# Patient Record
Sex: Female | Born: 1970 | Race: Black or African American | Hispanic: No | Marital: Single | Smoking: Never smoker
Health system: Southern US, Community
[De-identification: ages and names within clinical notes are randomized; demographics above are authoritative.]

## PROBLEM LIST (undated history)

## (undated) DIAGNOSIS — Z9289 Personal history of other medical treatment: Secondary | ICD-10-CM

## (undated) DIAGNOSIS — D649 Anemia, unspecified: Secondary | ICD-10-CM

## (undated) DIAGNOSIS — N926 Irregular menstruation, unspecified: Secondary | ICD-10-CM

## (undated) DIAGNOSIS — R011 Cardiac murmur, unspecified: Secondary | ICD-10-CM

## (undated) DIAGNOSIS — I10 Essential (primary) hypertension: Secondary | ICD-10-CM

## (undated) DIAGNOSIS — R51 Headache: Secondary | ICD-10-CM

## (undated) DIAGNOSIS — N840 Polyp of corpus uteri: Secondary | ICD-10-CM

---

## 1992-04-06 HISTORY — PX: TUBAL LIGATION: SHX77

## 2002-04-06 DIAGNOSIS — N840 Polyp of corpus uteri: Secondary | ICD-10-CM

## 2002-04-06 HISTORY — DX: Polyp of corpus uteri: N84.0

## 2002-04-06 HISTORY — PX: DILATION AND CURETTAGE OF UTERUS: SHX78

## 2003-01-02 ENCOUNTER — Inpatient Hospital Stay (HOSPITAL_COMMUNITY): Admission: AD | Admit: 2003-01-02 | Discharge: 2003-01-03 | Payer: Self-pay | Admitting: *Deleted

## 2003-01-03 ENCOUNTER — Inpatient Hospital Stay (HOSPITAL_COMMUNITY): Admission: RE | Admit: 2003-01-03 | Discharge: 2003-01-03 | Payer: Self-pay | Admitting: *Deleted

## 2003-01-03 ENCOUNTER — Encounter: Payer: Self-pay | Admitting: *Deleted

## 2003-01-25 ENCOUNTER — Encounter (INDEPENDENT_AMBULATORY_CARE_PROVIDER_SITE_OTHER): Payer: Self-pay

## 2003-01-25 ENCOUNTER — Other Ambulatory Visit: Admission: RE | Admit: 2003-01-25 | Discharge: 2003-01-25 | Payer: Self-pay | Admitting: Family Medicine

## 2003-01-25 ENCOUNTER — Encounter: Admission: RE | Admit: 2003-01-25 | Discharge: 2003-01-25 | Payer: Self-pay | Admitting: Obstetrics and Gynecology

## 2003-02-08 ENCOUNTER — Encounter: Admission: RE | Admit: 2003-02-08 | Discharge: 2003-02-08 | Payer: Self-pay | Admitting: Obstetrics and Gynecology

## 2003-04-05 ENCOUNTER — Encounter: Admission: RE | Admit: 2003-04-05 | Discharge: 2003-04-05 | Payer: Self-pay | Admitting: Family Medicine

## 2003-04-09 ENCOUNTER — Ambulatory Visit (HOSPITAL_COMMUNITY): Admission: RE | Admit: 2003-04-09 | Discharge: 2003-04-09 | Payer: Self-pay | Admitting: Family Medicine

## 2006-01-18 ENCOUNTER — Emergency Department (HOSPITAL_COMMUNITY): Admission: EM | Admit: 2006-01-18 | Discharge: 2006-01-18 | Payer: Self-pay | Admitting: Emergency Medicine

## 2006-10-27 ENCOUNTER — Inpatient Hospital Stay (HOSPITAL_COMMUNITY): Admission: AD | Admit: 2006-10-27 | Discharge: 2006-10-27 | Payer: Self-pay | Admitting: Gynecology

## 2008-07-09 ENCOUNTER — Emergency Department (HOSPITAL_COMMUNITY): Admission: EM | Admit: 2008-07-09 | Discharge: 2008-07-09 | Payer: Self-pay | Admitting: Emergency Medicine

## 2010-07-16 LAB — POCT I-STAT, CHEM 8
BUN: 10 mg/dL (ref 6–23)
Calcium, Ion: 1.16 mmol/L (ref 1.12–1.32)
Chloride: 105 mEq/L (ref 96–112)
HCT: 34 % — ABNORMAL LOW (ref 36.0–46.0)
Potassium: 3.7 mEq/L (ref 3.5–5.1)

## 2010-08-22 NOTE — Group Therapy Note (Signed)
   [name], [name] NO.:  [id number]   MEDICAL RECORD NO.:  [id number]                   PATIENT TYPE:  OUT   LOCATION:  [facility]                           FACILITY:  [facility]   PHYSICIAN:  Tinnie Gens, MD                     DATE OF BIRTH:  [birth year]   DATE OF SERVICE:  02/08/2003                                    CLINIC NOTE   CHIEF COMPLAINT:  Follow-up.   SUMMARY:  The patient is a 40 year old G2 para 2-0-0-3 who had been seen  several weeks ago for bleeding x2 months and significant abdominal pain.  She had a workup at that time that included a Pap smear that was within  normal limits, hemoglobin of 9.6, platelet count of 236, TSH of 1.32, FSH of  5.2 and LH of 9.0.  All of those with the exception of hemoglobin were  within normal limits.  She also had an endometrial biopsy at that time that  showed benign proliferative endometrium.  She had previously had an  ultrasound that showed an enlarged uterus and a thick stripe.  She reports  that she has not had a period since October 2004.  Her pain has been  improving with a trial of Naprosyn.  All these results were reviewed with  the patient.  She will take several months to see what her bleeding pattern  turns into and then will return here to discuss treatment options.  It is  possible she will need her endometrial canal evaluated - she could have a  polyp or some other reason for her bleeding, or hormonal management may be  an option for her as well.  She will return in two months.                                               Tinnie Gens, MD    TP/MEDQ  D:  02/08/2003  T:  02/08/2003  Job:  [id number]

## 2010-08-22 NOTE — Group Therapy Note (Signed)
Brenda [name], Brenda [name] NO.:  [id number]   MEDICAL RECORD NO.:  [id number]                   PATIENT TYPE:  OUT   LOCATION:  [facility]                           FACILITY:  [facility]   PHYSICIAN:  Tinnie Gens, MD                     DATE OF BIRTH:  1971/02/18   DATE OF SERVICE:  01/25/2003                                    CLINIC NOTE   CHIEF COMPLAINT:  Abnormal bleeding.   HISTORY OF PRESENT ILLNESS:  The patient is a 40 year old gravida 2 para 2-0-  0-3 who comes in today for workup of abnormal bleeding.  She reports that  she had been having heavier cycles than normal but that they were monthly  until August 2004.  At that time she has had menses for approximately two  months.  She was seen in the emergency room for this problem at the end of  September.  She had a negative pregnancy test at that time and was not found  to be anemic.  She also had a sonogram at that time that showed an enlarged  uterus and a thickened endometrial stripe but no clear fibroid.  She was  started on a pack of Yasmin and her bleeding has since stopped.  She prior  to this was not having any bleeding after intercourse.  She does report that  her bleeding was fairly heavy and she was having a lot of cramping and pain  with clot passage.  She reports diffuse lower abdominal pain as well as low  back pain.  Also at the time when she was worked up previously she had a  nonreactive RPR, a negative GC and chlamydia, a negative UA, and the  ultrasound showed a 3 cm simple cyst on the right ovary.   PAST MEDICAL HISTORY:  She has history of hypertension which she was  previously on medication for; however, this has been stopped because her  blood pressure seemed better.   PAST SURGICAL HISTORY:  She has had C-section x2 as well as cyst removal  under her left arm.   MEDICATIONS:  None.   ALLERGIES:  None.   OBSTETRICAL HISTORY:  She is a G2 P2, two C-sections.  The second  pregnancy  was a twin gestation.  She also had tubal ligation with her last C-section.   GYNECOLOGICAL HISTORY:  Her last Pap was in 1998 but she has never had an  abnormal one.  As stated previously, she had normal menses until this last  two months.   SOCIAL HISTORY:  She is nonsmoker and nondrinker.  She denies other drug  use.  She does work outside the home.  She lives with her husband and three  children.   FAMILY HISTORY:  Significant for diabetes, hypertension, and lung cancer.   REVIEW OF SYSTEMS:  Is reviewed, and is positive for pedal edema, fever,  night sweats  and hot flashes, fatigue, frequent headaches and dizzy spells  associated with when her blood pressure gets high, nausea and vomiting  associated with gas in use.  Otherwise, her 14-point review of systems is  negative.   PHYSICAL EXAMINATION:  VITAL SIGNS:  Today on physical exam her blood  pressure is 130/88, weight is 249.6, height is 5 feet 7 inches, pulse is 88.  GENERAL:  She is a moderately-obese black female in no acute distress.  HEENT:  Sclerae anicteric.  NECK:  Supple.  The thyroid feels somewhat enlarged diffusely without  nodule.  BREASTS:  Show no supraclavicular or axillary lymphadenopathy.  The breasts  are symmetric with everted nipples.  There are no masses.  ABDOMEN:  Soft and essentially nontender.  LUNGS:  Clear.  CARDIOVASCULAR:  Regular rate and rhythm.  No rubs, gallops, or murmurs.  GENITOURINARY:  Shows normal external female genitalia.  The cervix is  posterior and nulliparous in appearance.  It is also deviated off to the  left side.   PROCEDURE:  The cervix is grasped with a single tooth tenaculum and the  cervix cleaned with Betadine.  An endometrial biopsy Pipelle is inserted  without difficulty and the uterus sounds to 9.5 cm.  Endometrial biopsy is  obtained and the tenaculum removed easily.  On bimanual exam the uterus  feels about 10 to 12 weeks size.  It is somewhat tender.   There is no true  cervical motion tenderness.  Her adnexa are diffusely tender but exam was  limited secondary to the patient's body habitus.   IMPRESSION:  1. Menometrorrhagia, questionable etiology.  No clear fibroids.  She did     have a thickened endometrium.  Hormonal issues may be at play.  2. GYN exam with Pap smear.  The patient needs this to be updated.  3. Pelvic pain, unclear etiology.   PLAN:  1. Pap smear today.  2. Endometrial biopsy today.  3. TSH, FSH, LH, CBC, and Pap, and endometrial biopsy.   The patient will return in two weeks to discuss findings and possible  treatment options.  At this point not clear exactly what is causing her  pain.  Have started her on a trial of Naprosyn 500 mg one p.o. b.i.d. for  the next two weeks to see if this improves her pain significantly.  Will  also wait to see what her normal menstrual pattern becomes after this one  event.  All this was discussed with the patient.  She understands and will  follow up in two weeks.                                               Tinnie Gens, MD    TP/MEDQ  D:  01/25/2003  T:  01/25/2003  Job:  [id number]

## 2010-08-22 NOTE — Group Therapy Note (Signed)
[name], [name] NO.:  [id number]   MEDICAL RECORD NO.:  [id number]                   PATIENT TYPE:  OUT   LOCATION:  [facility]                           FACILITY:  [facility]   PHYSICIAN:  Tinnie Gens, MD                     DATE OF BIRTH:  1971/03/08   DATE OF SERVICE:  04/05/2003                                    CLINIC NOTE   CHIEF COMPLAINT:  Abnormal bleeding.   HISTORY OF PRESENT ILLNESS:  The patient is a 40 year old G2 P2-0-0-3 who  has been seen by me previously, has been tried on nonsteroidals as well as  oral contraceptives which she has not tolerated well and continues to have  heavy menses with passage of large amount of clots.  Previously on  ultrasound she had thickened endometrium.  She did have a benign endometrial  biopsy with me.  She does have associated nausea, vomiting, and sweating.  She is wondering if she is going through menopause.  Her periods remain  regular.   PHYSICAL EXAMINATION TODAY:  VITAL SIGNS:  Blood pressure 152/85, weight is  248.3.  GENERAL:  She is an obese black female in no acute distress.  ABDOMEN:  Soft, nontender, nondistended.  PELVIC:  The introitus is tight, small.  The uterus feels anteverted but has  no downward mobility.  A NuvaRing is placed without difficulty.   IMPRESSION:  Menorrhagia, questionable etiology.   PLAN:  Trial of NuvaRing plus nonsteroidals at the time of menses.  Instructions were given to the patient regarding how to place this, when to  remove it, as well as appropriate time to take Naprosyn.  She will also be  scheduled for sonohysterogram.  She will follow up in two months to discuss  further plans after ultrasound is completed.                                               Tinnie Gens, MD    TP/MEDQ  D:  04/05/2003  T:  04/05/2003  Job:  [id number]

## 2010-10-02 ENCOUNTER — Inpatient Hospital Stay (HOSPITAL_COMMUNITY)
Admission: AD | Admit: 2010-10-02 | Discharge: 2010-10-03 | Disposition: A | Discharge status: Still In Hospital | Payer: Self-pay | Source: Ambulatory Visit | Attending: Family Medicine | Admitting: Family Medicine

## 2010-10-02 DIAGNOSIS — L02219 Cutaneous abscess of trunk, unspecified: Secondary | ICD-10-CM | POA: Insufficient documentation

## 2010-10-02 DIAGNOSIS — L03319 Cellulitis of trunk, unspecified: Secondary | ICD-10-CM | POA: Insufficient documentation

## 2010-10-02 LAB — CBC
HCT: 28.1 % — ABNORMAL LOW (ref 36.0–46.0)
Hemoglobin: 8.5 g/dL — ABNORMAL LOW (ref 12.0–15.0)
MCV: 73.9 fL — ABNORMAL LOW (ref 78.0–100.0)
RBC: 3.8 MIL/uL — ABNORMAL LOW (ref 3.87–5.11)
RDW: 15 % (ref 11.5–15.5)
WBC: 10.6 10*3/uL — ABNORMAL HIGH (ref 4.0–10.5)

## 2010-10-02 LAB — POCT PREGNANCY, URINE: Preg Test, Ur: NEGATIVE

## 2010-10-02 LAB — URINALYSIS, ROUTINE W REFLEX MICROSCOPIC
Bilirubin Urine: NEGATIVE
Glucose, UA: NEGATIVE mg/dL
Hgb urine dipstick: NEGATIVE
Specific Gravity, Urine: 1.02 (ref 1.005–1.030)
Urobilinogen, UA: 2 mg/dL — ABNORMAL HIGH (ref 0.0–1.0)
pH: 6 (ref 5.0–8.0)

## 2010-10-02 LAB — DIFFERENTIAL
Basophils Absolute: 0 10*3/uL (ref 0.0–0.1)
Eosinophils Relative: 1 % (ref 0–5)
Lymphocytes Relative: 22 % (ref 12–46)
Lymphs Abs: 2.3 10*3/uL (ref 0.7–4.0)
Neutro Abs: 7.3 10*3/uL (ref 1.7–7.7)
Neutrophils Relative %: 69 % (ref 43–77)

## 2010-10-03 ENCOUNTER — Emergency Department (HOSPITAL_COMMUNITY)
Admission: EM | Admit: 2010-10-03 | Discharge: 2010-10-03 | Disposition: A | Discharge status: Home / Self Care | Payer: Self-pay | Attending: Emergency Medicine | Admitting: Emergency Medicine

## 2010-10-03 DIAGNOSIS — L03319 Cellulitis of trunk, unspecified: Secondary | ICD-10-CM | POA: Insufficient documentation

## 2010-10-03 DIAGNOSIS — R6883 Chills (without fever): Secondary | ICD-10-CM | POA: Insufficient documentation

## 2010-10-03 DIAGNOSIS — L02219 Cutaneous abscess of trunk, unspecified: Secondary | ICD-10-CM | POA: Insufficient documentation

## 2010-10-04 ENCOUNTER — Emergency Department (HOSPITAL_COMMUNITY)
Admission: EM | Admit: 2010-10-04 | Discharge: 2010-10-04 | Disposition: A | Discharge status: Home / Self Care | Payer: Self-pay | Attending: Emergency Medicine | Admitting: Emergency Medicine

## 2010-10-04 DIAGNOSIS — L0291 Cutaneous abscess, unspecified: Secondary | ICD-10-CM | POA: Insufficient documentation

## 2010-10-04 DIAGNOSIS — Z4801 Encounter for change or removal of surgical wound dressing: Secondary | ICD-10-CM | POA: Insufficient documentation

## 2011-01-19 LAB — URINALYSIS, ROUTINE W REFLEX MICROSCOPIC
Leukocytes, UA: NEGATIVE
Nitrite: NEGATIVE
Specific Gravity, Urine: >1.03 — ABNORMAL HIGH
Urobilinogen, UA: 2 — ABNORMAL HIGH
pH: 6

## 2011-01-19 LAB — COMPREHENSIVE METABOLIC PANEL
CO2: 27
Calcium: 8.7
Creatinine, Ser: 0.93
GFR calc non Af Amer: >60
Glucose, Bld: 107 — ABNORMAL HIGH
Total Protein: 6.9

## 2011-01-19 LAB — CBC
Hemoglobin: 10.9 — ABNORMAL LOW
MCHC: 32.7
MCV: 82.6
RBC: 4.02
RDW: 14.3 — ABNORMAL HIGH

## 2011-01-19 LAB — WET PREP, GENITAL
Trich, Wet Prep: NONE SEEN
Yeast Wet Prep HPF POC: NONE SEEN

## 2011-01-19 LAB — URINE MICROSCOPIC-ADD ON

## 2011-01-19 LAB — GC/CHLAMYDIA PROBE AMP, GENITAL
Chlamydia, DNA Probe: NEGATIVE
GC Probe Amp, Genital: NEGATIVE

## 2011-01-19 LAB — POCT PREGNANCY, URINE
Operator id: 239321
Preg Test, Ur: NEGATIVE

## 2011-06-25 ENCOUNTER — Emergency Department (HOSPITAL_COMMUNITY)
Admission: EM | Admit: 2011-06-25 | Discharge: 2011-06-25 | Disposition: A | Discharge status: Home / Self Care | Payer: Self-pay | Attending: Emergency Medicine | Admitting: Emergency Medicine

## 2011-06-25 ENCOUNTER — Encounter (HOSPITAL_COMMUNITY): Payer: Self-pay | Admitting: *Deleted

## 2011-06-25 DIAGNOSIS — R6889 Other general symptoms and signs: Secondary | ICD-10-CM | POA: Insufficient documentation

## 2011-06-25 DIAGNOSIS — R070 Pain in throat: Secondary | ICD-10-CM | POA: Insufficient documentation

## 2011-06-25 DIAGNOSIS — J3489 Other specified disorders of nose and nasal sinuses: Secondary | ICD-10-CM | POA: Insufficient documentation

## 2011-06-25 DIAGNOSIS — J069 Acute upper respiratory infection, unspecified: Secondary | ICD-10-CM

## 2011-06-25 DIAGNOSIS — R0989 Other specified symptoms and signs involving the circulatory and respiratory systems: Secondary | ICD-10-CM | POA: Insufficient documentation

## 2011-06-25 MED ORDER — FLUTICASONE PROPIONATE 50 MCG/ACT NA SUSP: 2.0000 | Freq: Every day | NASAL | Status: AC

## 2011-06-25 NOTE — ED Provider Notes (Signed)
History     CSN: [id number]  Arrival date & time 06/25/11  1034   First MD Initiated Contact with Patient 06/25/11 1129      No chief complaint on file.   (Consider location/radiation/quality/duration/timing/severity/associated sxs/prior treatment) HPI  41 year old female presents with cold symptoms. Patient states for the past 4 or 5 days she has been having nasal congestion, runny nose, sore throat, occasional sneezing, and chest congestion. Symptoms is gradual in onset, and persistent. Symptom is mildly improving. States has a congestions is keeping her up at night. She has tried over-the-counter medication with minimal relief. Just complains of hoarseness. No significant cough. She denies, chest pain, nausea, vomiting, diarrhea or, abdominal pain. She would like to return to work and request for a work note.  No past medical history on file.  No past surgical history on file.  No family history on file.  History  Substance Use Topics  . Smoking status: Not on file  . Smokeless tobacco: Not on file  . Alcohol Use: Not on file    OB History    No data available      Review of Systems  All other systems reviewed and are negative.    Allergies  Review of patient's allergies indicates not on file.  Home Medications  No current outpatient prescriptions on file.  There were no vitals taken for this visit.  Physical Exam  Nursing note and vitals reviewed. Constitutional: She appears well-developed and well-nourished. No distress.       Awake, alert, nontoxic appearance  HENT:  Head: Atraumatic.  Right Ear: External ear normal.  Left Ear: External ear normal.  Nose: Nose normal.  Mouth/Throat: Uvula is midline, oropharynx is clear and moist and mucous membranes are normal. No oropharyngeal exudate, posterior oropharyngeal edema, posterior oropharyngeal erythema or tonsillar abscesses.  Eyes: Conjunctivae are normal. Right eye exhibits no discharge. Left eye  exhibits no discharge.  Neck: Neck supple.  Cardiovascular: Normal rate and regular rhythm.   Pulmonary/Chest: Effort normal. No respiratory distress. She has no wheezes. She has no rales. She exhibits no tenderness.  Abdominal: Soft. There is no tenderness. There is no rebound.  Musculoskeletal: Normal range of motion. She exhibits no tenderness.       ROM appears intact, no obvious focal weakness  Lymphadenopathy:    She has no cervical adenopathy.  Neurological: She is alert.       Mental status and motor strength appears intact  Skin: Skin is warm. No rash noted.  Psychiatric: She has a normal mood and affect.    ED Course  Procedures (including critical care time)  Labs Reviewed - No data to display No results found.   No diagnosis found.    MDM  URI symptoms. Her primary concern is nasal congestion. We'll prescribe Flonase for symptomatic treatment.        Fayrene Helper, PA-C 06/25/11 1152

## 2011-06-25 NOTE — ED Provider Notes (Signed)
Medical screening examination/treatment/procedure(s) were performed by non-physician practitioner and as supervising physician I was immediately available for consultation/collaboration.   Lyanne Co, MD 06/25/11 2251

## 2011-06-25 NOTE — Progress Notes (Signed)
ED CM spoke with pt who confirms she is self pay with no pcp.  States she generally sees a NP at her job.  CM provided and discussed a list of [place] self pay providers and [facility] contact number to assist pt with getting a pcp

## 2011-06-25 NOTE — Discharge Instructions (Signed)

## 2011-09-01 ENCOUNTER — Emergency Department: Payer: Self-pay | Admitting: Emergency Medicine

## 2013-09-04 ENCOUNTER — Encounter (HOSPITAL_COMMUNITY): Payer: Self-pay | Admitting: *Deleted

## 2013-09-04 ENCOUNTER — Inpatient Hospital Stay (HOSPITAL_COMMUNITY): Payer: No Typology Code available for payment source

## 2013-09-04 ENCOUNTER — Inpatient Hospital Stay (HOSPITAL_COMMUNITY)
Admission: AD | Admit: 2013-09-04 | Discharge: 2013-09-04 | Disposition: A | Discharge status: Home / Self Care | Payer: No Typology Code available for payment source | Source: Ambulatory Visit | Attending: Obstetrics & Gynecology | Admitting: Obstetrics & Gynecology

## 2013-09-04 DIAGNOSIS — N938 Other specified abnormal uterine and vaginal bleeding: Secondary | ICD-10-CM | POA: Insufficient documentation

## 2013-09-04 DIAGNOSIS — N939 Abnormal uterine and vaginal bleeding, unspecified: Secondary | ICD-10-CM

## 2013-09-04 DIAGNOSIS — N949 Unspecified condition associated with female genital organs and menstrual cycle: Secondary | ICD-10-CM | POA: Insufficient documentation

## 2013-09-04 DIAGNOSIS — R109 Unspecified abdominal pain: Secondary | ICD-10-CM | POA: Insufficient documentation

## 2013-09-04 DIAGNOSIS — D259 Leiomyoma of uterus, unspecified: Secondary | ICD-10-CM | POA: Insufficient documentation

## 2013-09-04 DIAGNOSIS — N926 Irregular menstruation, unspecified: Secondary | ICD-10-CM

## 2013-09-04 HISTORY — DX: Polyp of corpus uteri: N84.0

## 2013-09-04 LAB — CBC
HEMATOCRIT: 30.4 % — AB (ref 36.0–46.0)
HEMOGLOBIN: 9.4 g/dL — AB (ref 12.0–15.0)
MCH: 24.7 pg — ABNORMAL LOW (ref 26.0–34.0)
MCHC: 30.9 g/dL (ref 30.0–36.0)
MCV: 80 fL (ref 78.0–100.0)
Platelets: 222 10*3/uL (ref 150–400)
RBC: 3.8 MIL/uL — ABNORMAL LOW (ref 3.87–5.11)
RDW: 17.7 % — ABNORMAL HIGH (ref 11.5–15.5)
WBC: 7.9 10*3/uL (ref 4.0–10.5)

## 2013-09-04 LAB — URINALYSIS, ROUTINE W REFLEX MICROSCOPIC
Bilirubin Urine: NEGATIVE
Glucose, UA: 100 mg/dL — AB
Ketones, ur: 15 mg/dL — AB
LEUKOCYTES UA: NEGATIVE
NITRITE: POSITIVE — AB
PH: 6.5 (ref 5.0–8.0)
Protein, ur: >300 mg/dL — AB
UROBILINOGEN UA: 2 mg/dL — AB (ref 0.0–1.0)

## 2013-09-04 LAB — URINE MICROSCOPIC-ADD ON

## 2013-09-04 LAB — WET PREP, GENITAL
TRICH WET PREP: NONE SEEN
Yeast Wet Prep HPF POC: NONE SEEN

## 2013-09-04 LAB — POCT PREGNANCY, URINE: Preg Test, Ur: NEGATIVE

## 2013-09-04 MED ORDER — MEDROXYPROGESTERONE ACETATE 10 MG PO TABS: 10.0000 mg | ORAL_TABLET | Freq: Every day | ORAL | Status: DC

## 2013-09-04 NOTE — ED Provider Notes (Deleted)
History     CSN: [id number]  Arrival date and time: 09/04/13 1826    First Provider Initiated Contact with Patient 09/04/13 2016         Chief Complaint  Patient presents with  . Vaginal Bleeding  . Abdominal Cramping   HPI Ms. Brenda [name] is a G28P2003 43 y.o. female who presents for vaginal bleeding & abdominal cramping.  "Started spotting on 5-13 then again on 5-24 and by 5-26 she had heavy bleeding and passing clots every day. Changes a pad about every 1 1/2-2 hours. Has some mild cramping." States this bleeding is similar to episode of abnormal bleeding she had in 2004 when she had D&C for endometrial polyps (by Dr. Kennon Rounds). States Saturday had moderate amount of bleeding that saturated pads & her pants (unable to quantify number of pads or how long lasted). Since then, changes pads hourly: wears overnight pads that are full from "front to back" when she changes them but not saturated. Reports many small clots. Last normal period was 4/10 & lasted for 3 days. Lower abdominal cramping started 5/13; intermittent & rates 5/10; took ibuprofen once with mild relief but didn't take again because thought it increased her bleeding. Denies aggravating factors. Reports lightheadedness & blurry vision sporadically since Saturday; has occurred in seated & standing positions.  Denies fever. Denies urinary complaints. Denies vaginal discharge/irritation.Denies chest pain or shortness of breath. Denies headache.   Last had sex in March; currently not sexually active. No birth control since 2004. Cycle length 25-30 days. Menarche at 43yrs old. No family hx of female cancers. No PCP. Last pap smear 2004; abnormal, no follow up. Denies STD history.    Past Medical History  Diagnosis Date  . Endometrial polyp 2004    "polyps around the womb"    Past Surgical History  Procedure Laterality Date  . Dilation and curettage of uterus  2004    for endometrial polyp  . Cesarean section      Family  History  Problem Relation Age of Onset  . Hypertension Maternal Grandmother     History  Substance Use Topics  . Smoking status: Never Smoker   . Smokeless tobacco: Never Used  . Alcohol Use: No    Allergies: No Known Allergies  Prescriptions prior to admission  Medication Sig Dispense Refill  . ibuprofen (ADVIL,MOTRIN) 400 MG tablet Take 800 mg by mouth once.        Review of Systems  Constitutional: Positive for malaise/fatigue. Negative for fever, chills and diaphoresis.  Eyes: Positive for blurred vision.  Respiratory: Negative for shortness of breath.   Cardiovascular: Negative.  Negative for chest pain.  Gastrointestinal: Positive for nausea and abdominal pain. Negative for vomiting, diarrhea and constipation.  Genitourinary: Negative for dysuria, urgency, frequency and flank pain.       Positive for vaginal bleeding.  Negative for vaginal discharge. Negative for vaginal irritation.   Neurological: Negative for weakness and headaches.    Physical Exam   Blood pressure 157/92, pulse 95, temperature 98.5 F (36.9 C), temperature source Oral, resp. rate 20, height 5' 5.5" (1.664 m), weight 253 lb 3.2 oz (114.851 kg), last menstrual period 08/27/2013, SpO2 99.00%.  Orthostatic vital signs  Lying: BP 154/94, pulse 91 Sitting: BP 163/87, pulse 81 Standing @ 62min: BP 167/104, pulse 88 Standing @ 37min: BP 158/94, pulse 90  Physical Exam  Constitutional: She is oriented to person, place, and time. She appears well-developed and well-nourished. No distress.  HENT:  Head:  Physical Exam  Constitutional: She is oriented to person, place, and time. She appears well-developed and well-nourished. No distress.  HENT:  Head: Normocephalic.  Cardiovascular: Normal rate and regular rhythm.  Respiratory: Effort normal and breath sounds normal. No respiratory distress.  GI: Soft. Bowel sounds are normal. She exhibits no distension. There is tenderness in the suprapubic area. There is no rebound, no guarding and no CVA tenderness.  Genitourinary: Uterus is enlarged. Uterus is not tender. Cervix exhibits no motion  tenderness and no friability. Right adnexum displays no mass and no tenderness. Left adnexum displays no mass and no tenderness. There is erythema around the vagina.  Small amount of dark red blood oozing from cervical os.  Uterus mildly enlarged, difficult exam due to body habitus.  Neurological: She is alert and oriented to person, place, and time.  Skin: Skin is warm and dry. No erythema.  Psychiatric: She has a normal mood and affect. Her behavior is normal. Judgment and thought content normal.   MAU Course   Procedures  Results for orders placed during the hospital encounter of 09/04/13 (from the past 24 hour(s))   URINALYSIS, ROUTINE W REFLEX MICROSCOPIC Status: Abnormal    Collection Time    09/04/13 7:04 PM   Result  Value  Ref Range    Color, Urine  RED (*)  YELLOW    APPearance  TURBID (*)  CLEAR    Specific Gravity, Urine  >1.030 (*)  1.005 - 1.030    pH  6.5  5.0 - 8.0    Glucose, UA  100 (*)  NEGATIVE mg/dL    Hgb urine dipstick  LARGE (*)  NEGATIVE    Bilirubin Urine  NEGATIVE  NEGATIVE    Ketones, ur  15 (*)  NEGATIVE mg/dL    Protein, ur  >300 (*)  NEGATIVE mg/dL    Urobilinogen, UA  2.0 (*)  0.0 - 1.0 mg/dL    Nitrite  POSITIVE (*)  NEGATIVE    Leukocytes, UA  NEGATIVE  NEGATIVE   URINE MICROSCOPIC-ADD ON Status: Abnormal    Collection Time    09/04/13 7:04 PM   Result  Value  Ref Range    Bacteria, UA  MANY (*)  RARE    Urine-Other  FIELD OBSCURED BY RBC'S    POCT PREGNANCY, URINE Status: None    Collection Time    09/04/13 7:09 PM   Result  Value  Ref Range    Preg Test, Ur  NEGATIVE  NEGATIVE   CBC Status: Abnormal    Collection Time    09/04/13 8:22 PM   Result  Value  Ref Range    WBC  7.9  4.0 - 10.5 K/uL    RBC  3.80 (*)  3.87 - 5.11 MIL/uL    Hemoglobin  9.4 (*)  12.0 - 15.0 g/dL    HCT  30.4 (*)  36.0 - 46.0 %    MCV  80.0  78.0 - 100.0 fL    MCH  24.7 (*)  26.0 - 34.0 pg    MCHC  30.9  30.0 - 36.0 g/dL    RDW  17.7 (*)  11.5 - 15.5 %     Platelets  222  150 - 400 K/uL   WET PREP, GENITAL Status: Abnormal    Collection Time    09/04/13 9:23 PM   Result  Value  Ref Range    Yeast Wet Prep HPF POC  NONE SEEN  NONE SEEN      performed. Transabdominal technique was performed for global imaging of the pelvis including uterus, ovaries, adnexal regions, and pelvic cul-de-sac. It was necessary to proceed with endovaginal exam following the transabdominal exam to visualize the endometrium and ovaries to better advantage.  COMPARISON:  None  FINDINGS: Uterus  Measurements: 9.4 cm x 4.4 cm x 4.6 cm. An exophytic mildly hypoechoic fibroid protrudes from the uterine fundus measuring 17 mm x 14 mm x 20 mm. There is a small mural fibroid along the anterior upper uterine segment measuring 8 mm in greatest dimension. No other uterine masses.  Endometrium  Thickness: 7 mm.  No focal abnormality visualized.  Right ovary  Measurements: 4.8 cm x 3.2 cm x 2.2 cm. Dominant, 2.6 cm, simple appearing cyst. Ovary otherwise unremarkable. No adnexal masses.  Left ovary  Not visualized.  No left adnexal mass.  Other findings  No free fluid.  IMPRESSION: 1. Two uterine fibroids. Uterus otherwise unremarkable. Neither fibroid affects the endometrium. 2. Endometrium is normal thickness and smooth. 3. No findings to explain abnormal uterine bleeding. 4. Left ovary not visualized. No left adnexal mass. Normal right ovary with a dominant follicular cyst.   Electronically Signed   By: Lajean Manes M.D.   On: 09/04/2013 22:27   US Pelvis Complete  09/04/2013   CLINICAL DATA:  Abnormal uterine bleeding.  EXAM: TRANSABDOMINAL AND TRANSVAGINAL ULTRASOUND OF PELVIS  TECHNIQUE: Both  transabdominal and transvaginal ultrasound examinations of the pelvis were performed. Transabdominal technique was performed for global imaging of the pelvis including uterus, ovaries, adnexal regions, and pelvic cul-de-sac. It was necessary to proceed with endovaginal exam following the transabdominal exam to visualize the endometrium and ovaries to better advantage.  COMPARISON:  None  FINDINGS: Uterus  Measurements: 9.4 cm x 4.4 cm x 4.6 cm. An exophytic mildly hypoechoic fibroid protrudes from the uterine fundus measuring 17 mm x 14 mm x 20 mm. There is a small mural fibroid along the anterior upper uterine segment measuring 8 mm in greatest dimension. No other uterine masses.  Endometrium  Thickness: 7 mm.  No focal abnormality visualized.  Right ovary  Measurements: 4.8 cm x 3.2 cm x 2.2 cm. Dominant, 2.6 cm, simple appearing cyst. Ovary otherwise unremarkable. No adnexal masses.  Left ovary  Not visualized.  No left adnexal mass.  Other findings  No free fluid.  IMPRESSION: 1. Two uterine fibroids. Uterus otherwise unremarkable. Neither fibroid affects the endometrium. 2. Endometrium is normal thickness and smooth. 3. No findings to explain abnormal uterine bleeding. 4. Left ovary not visualized. No left adnexal mass. Normal right ovary with a dominant follicular cyst.   Electronically Signed   By: Lajean Manes M.D.   On: 09/04/2013 22:27    MDM Orthostatic VS normal. 2130- Pelvic exam completed. Pt declines pain meds at this time.   Assessment and Plan  A: 1. Abnormal uterine bleeding (AUB)   2. Uterine fibroid    P: Discharge home F/u with [facility] 6/3 @ 3pm for endometrial biopsy Rx for Provera sent to patient's pharmacy Discussed reasons to return to [facility] Continue to take iron supplements  Maren Beach, Student-NP  09/04/2013, 10:34 PM   I have seen and evaluated the patient with the NP student. I agree with the assessment and plan as written above.   Farris Has,  PA-C 09/05/2013 1:08 AM

## 2013-09-04 NOTE — MAU Note (Signed)
Patient states she has had regular periods until the last one. Started spotting on 5-13 then again on 5-24 and by 5-26 she had heavy bleeding and passing clots every day. Changes a pad about every 1 1/2-2 hours. Has some mild cramping.

## 2013-09-05 LAB — GC/CHLAMYDIA PROBE AMP
CT PROBE, AMP APTIMA: NEGATIVE
GC PROBE AMP APTIMA: NEGATIVE

## 2013-09-06 ENCOUNTER — Ambulatory Visit (INDEPENDENT_AMBULATORY_CARE_PROVIDER_SITE_OTHER): Payer: No Typology Code available for payment source | Admitting: Obstetrics and Gynecology

## 2013-09-06 ENCOUNTER — Other Ambulatory Visit (HOSPITAL_COMMUNITY)
Admission: RE | Admit: 2013-09-06 | Discharge: 2013-09-06 | Disposition: A | Discharge status: Home / Self Care | Payer: No Typology Code available for payment source | Source: Ambulatory Visit | Attending: Obstetrics and Gynecology | Admitting: Obstetrics and Gynecology

## 2013-09-06 VITALS — BP 145/94 | HR 95 | Temp 98.1°F | Ht 67.0 in | Wt 250.0 lb

## 2013-09-06 DIAGNOSIS — R6889 Other general symptoms and signs: Secondary | ICD-10-CM | POA: Insufficient documentation

## 2013-09-06 DIAGNOSIS — N926 Irregular menstruation, unspecified: Secondary | ICD-10-CM

## 2013-09-06 DIAGNOSIS — N939 Abnormal uterine and vaginal bleeding, unspecified: Secondary | ICD-10-CM

## 2013-09-06 NOTE — Progress Notes (Signed)
Patient here today for f/u MAU for AUB and fibroids. Possible Endometrial biopsy today. UPT was obtained two days ago and negative. Patient states she has not had sexual intercourse in the last two days. No UPT obtained today.

## 2013-09-06 NOTE — Progress Notes (Signed)
Subjective:    Patient ID: Brenda [name], female    DOB: [birth year], 43 y.o.   MRN: [id number]  HPI 43 yo G2P2003 with LMP 5/24 presenting today as MAU follow up for evaluation of abnormal uterine bleeding. Patient describes normal monthly cycles lasting 3-4 days with the second day the heaviest with passage of clots. Patient reports a 10 day period in May which is still ongoing. She describes a similar episode in 2004 when she was diagnosed with endometrial polyps and underwent a D&C. Patient is otherwise without complaint.s She has not started the provera prescribed from the MAU.  Past Medical History  Diagnosis Date  . Endometrial polyp 2004    "polyps around the womb"   Past Surgical History  Procedure Laterality Date  . Dilation and curettage of uterus  2004    for endometrial polyp  . Cesarean section     Family History  Problem Relation Age of Onset  . Hypertension Maternal Grandmother    History  Substance Use Topics  . Smoking status: Never Smoker   . Smokeless tobacco: Never Used  . Alcohol Use: No      Review of Systems  All other systems reviewed and are negative.      Objective:   Physical Exam  GENERAL: Well-developed, well-nourished female in no acute distress.  ABDOMEN: Soft, nontender, nondistended. Obese PELVIC: Normal external female genitalia. Vagina is pink and rugated.  Normal discharge. Normal appearing cervix. Uterus is normal in size. No adnexal mass or tenderness. EXTREMITIES: No cyanosis, clubbing, or edema, 2+ distal pulses.  6/1 Ultrasound  US Pelvis Complete  09/04/2013 CLINICAL DATA: Abnormal uterine bleeding. EXAM: TRANSABDOMINAL AND TRANSVAGINAL ULTRASOUND OF PELVIS TECHNIQUE: Both transabdominal and transvaginal ultrasound examinations of the pelvis were performed. Transabdominal technique was performed for global imaging of the pelvis including uterus, ovaries, adnexal regions, and pelvic cul-de-sac. It was necessary to proceed with  endovaginal exam following the transabdominal exam to visualize the endometrium and ovaries to better advantage. COMPARISON: None FINDINGS: Uterus Measurements: 9.4 cm x 4.4 cm x 4.6 cm. An exophytic mildly hypoechoic fibroid protrudes from the uterine fundus measuring 17 mm x 14 mm x 20 mm. There is a small mural fibroid along the anterior upper uterine segment measuring 8 mm in greatest dimension. No other uterine masses. Endometrium Thickness: 7 mm. No focal abnormality visualized. Right ovary Measurements: 4.8 cm x 3.2 cm x 2.2 cm. Dominant, 2.6 cm, simple appearing cyst. Ovary otherwise unremarkable. No adnexal masses. Left ovary Not visualized. No left adnexal mass. Other findings No free fluid. IMPRESSION: 1. Two uterine fibroids. Uterus otherwise unremarkable. Neither fibroid affects the endometrium. 2. Endometrium is normal thickness and smooth. 3. No findings to explain abnormal uterine bleeding. 4. Left ovary not visualized. No left adnexal mass. Normal right ovary with a dominant follicular cyst. Electronically Signed By: Lajean Manes M.D. On: 09/04/2013 22:27      Assessment & Plan:  43 yo with abnormal uterine bleeding - Discussed the need to perform an endometrial biopsy ENDOMETRIAL BIOPSY     The indications for endometrial biopsy were reviewed.   Risks of the biopsy including cramping, bleeding, infection, uterine perforation, inadequate specimen and need for additional procedures  were discussed. The patient states she understands and agrees to undergo procedure today. Consent was signed. Time out was performed. Urine HCG was negative. A sterile speculum was placed in the patient's vagina and the cervix was prepped with Betadine. A single-toothed tenaculum was placed on the anterior  lip of the cervix to stabilize it. The uterine cavity was sounded to a depth of 9 cm using the uterine sound. The 3 mm pipelle was introduced into the endometrial cavity without difficulty, 2 passes were made.   A  moderate amount of tissue was  sent to pathology. The instruments were removed from the patient's vagina. Minimal bleeding from the cervix was noted. The patient tolerated the procedure well.  Routine post-procedure instructions were given to the patient. The patient will follow up in two weeks to review the results and for further management.   - Discussed medical management vs surgical management of abnormal uterine bleeding. Patient inclined to try Mirena IUD - RTC in 2 weeks to discuss results and further management

## 2013-09-07 NOTE — MAU Provider Note (Signed)
History   CSN: [id number]  Arrival date and time: 09/04/13 1826  First Provider Initiated Contact with Patient 09/04/13 2016  Chief Complaint   Patient presents with   .  Vaginal Bleeding   .  Abdominal Cramping    HPI  Ms. Brenda [name] is a G23P2003 43 y.o. female who presents for vaginal bleeding & abdominal cramping.  "Started spotting on 5-13 then again on 5-24 and by 5-26 she had heavy bleeding and passing clots every day. Changes a pad about every 1 1/2-2 hours. Has some mild cramping." States this bleeding is similar to episode of abnormal bleeding she had in 2004 when she had D&C for endometrial polyps (by Dr. Shawnie Pons). States Saturday had moderate amount of bleeding that saturated pads & her pants (unable to quantify number of pads or how long lasted). Since then, changes pads hourly: wears overnight pads that are full from "front to back" when she changes them but not saturated. Reports many small clots. Last normal period was 4/10 & lasted for 3 days. Lower abdominal cramping started 5/13; intermittent & rates 5/10; took ibuprofen once with mild relief but didn't take again because thought it increased her bleeding. Denies aggravating factors. Reports lightheadedness & blurry vision sporadically since Saturday; has occurred in seated & standing positions.  Denies fever. Denies urinary complaints. Denies vaginal discharge/irritation.Denies chest pain or shortness of breath. Denies headache.  Last had sex in March; currently not sexually active. No birth control since 2004. Cycle length 25-30 days. Menarche at 43yrs old. No family hx of female cancers. No PCP. Last pap smear 2004; abnormal, no follow up. Denies STD history.   Past Medical History   Diagnosis  Date   .  Endometrial polyp  2004     "polyps around the womb"    Past Surgical History   Procedure  Laterality  Date   .  Dilation and curettage of uterus   2004     for endometrial polyp   .  Cesarean section      Family  History   Problem  Relation  Age of Onset   .  Hypertension  Maternal Grandmother     History   Substance Use Topics   .  Smoking status:  Never Smoker   .  Smokeless tobacco:  Never Used   .  Alcohol Use:  No   Allergies: No Known Allergies  Prescriptions prior to admission   Medication  Sig  Dispense  Refill   .  ibuprofen (ADVIL,MOTRIN) 400 MG tablet  Take 800 mg by mouth once.      Review of Systems  Constitutional: Positive for malaise/fatigue. Negative for fever, chills and diaphoresis.  Eyes: Positive for blurred vision.  Respiratory: Negative for shortness of breath.  Cardiovascular: Negative. Negative for chest pain.  Gastrointestinal: Positive for nausea and abdominal pain. Negative for vomiting, diarrhea and constipation.  Genitourinary: Negative for dysuria, urgency, frequency and flank pain.  Positive for vaginal bleeding.  Negative for vaginal discharge.  Negative for vaginal irritation.  Neurological: Negative for weakness and headaches.   Physical Exam   Blood pressure 157/92, pulse 95, temperature 98.5 F (36.9 C), temperature source Oral, resp. rate 20, height 5' 5.5" (1.664 m), weight 253 lb 3.2 oz (114.851 kg), last menstrual period 08/27/2013, SpO2 99.00%.   Orthostatic vital signs  Lying: BP 154/94, pulse 91  Sitting: BP 163/87, pulse 81  Standing @ : BP 167/104, pulse 88  Standing @ : BP 158/94, pulse 90  Physical Exam  Constitutional: She is oriented to person, place, and time. She appears well-developed and well-nourished. No distress.  HENT:  Head: Normocephalic.  Cardiovascular: Normal rate and regular rhythm.  Respiratory: Effort normal and breath sounds normal. No respiratory distress.  GI: Soft. Bowel sounds are normal. She exhibits no distension. There is tenderness in the suprapubic area. There is no rebound, no guarding and no CVA tenderness.  Genitourinary: Uterus is enlarged. Uterus is not tender. Cervix exhibits no motion  tenderness and no friability. Right adnexum displays no mass and no tenderness. Left adnexum displays no mass and no tenderness. There is erythema around the vagina.  Small amount of dark red blood oozing from cervical os.  Uterus mildly enlarged, difficult exam due to body habitus.  Neurological: She is alert and oriented to person, place, and time.  Skin: Skin is warm and dry. No erythema.  Psychiatric: She has a normal mood and affect. Her behavior is normal. Judgment and thought content normal.   MAU Course   Procedures  Results for orders placed during the hospital encounter of 09/04/13 (from the past 24 hour(s))   URINALYSIS, ROUTINE W REFLEX MICROSCOPIC Status: Abnormal    Collection Time    09/04/13 7:04 PM   Result  Value  Ref Range    Color, Urine  RED (*)  YELLOW    APPearance  TURBID (*)  CLEAR    Specific Gravity, Urine  >1.030 (*)  1.005 - 1.030    pH  6.5  5.0 - 8.0    Glucose, UA  100 (*)  NEGATIVE mg/dL    Hgb urine dipstick  LARGE (*)  NEGATIVE    Bilirubin Urine  NEGATIVE  NEGATIVE    Ketones, ur  15 (*)  NEGATIVE mg/dL    Protein, ur  >300 (*)  NEGATIVE mg/dL    Urobilinogen, UA  2.0 (*)  0.0 - 1.0 mg/dL    Nitrite  POSITIVE (*)  NEGATIVE    Leukocytes, UA  NEGATIVE  NEGATIVE   URINE MICROSCOPIC-ADD ON Status: Abnormal    Collection Time    09/04/13 7:04 PM   Result  Value  Ref Range    Bacteria, UA  MANY (*)  RARE    Urine-Other  FIELD OBSCURED BY RBC'S    POCT PREGNANCY, URINE Status: None    Collection Time    09/04/13 7:09 PM   Result  Value  Ref Range    Preg Test, Ur  NEGATIVE  NEGATIVE   CBC Status: Abnormal    Collection Time    09/04/13 8:22 PM   Result  Value  Ref Range    WBC  7.9  4.0 - 10.5 K/uL    RBC  3.80 (*)  3.87 - 5.11 MIL/uL    Hemoglobin  9.4 (*)  12.0 - 15.0 g/dL    HCT  30.4 (*)  36.0 - 46.0 %    MCV  80.0  78.0 - 100.0 fL    MCH  24.7 (*)  26.0 - 34.0 pg    MCHC  30.9  30.0 - 36.0 g/dL    RDW  17.7 (*)  11.5 - 15.5 %     Platelets  222  150 - 400 K/uL   WET PREP, GENITAL Status: Abnormal    Collection Time    09/04/13 9:23 PM   Result  Value  Ref Range    Yeast Wet Prep HPF POC  NONE SEEN  NONE SEEN  Trich, Wet Prep  NONE SEEN  NONE SEEN    Clue Cells Wet Prep HPF POC  FEW (*)  NONE SEEN    WBC, Wet Prep HPF POC  FEW (*)  NONE SEEN    US Transvaginal Non-ob  09/04/2013 CLINICAL DATA: Abnormal uterine bleeding. EXAM: TRANSABDOMINAL AND TRANSVAGINAL ULTRASOUND OF PELVIS TECHNIQUE: Both transabdominal and transvaginal ultrasound examinations of the pelvis were performed. Transabdominal technique was performed for global imaging of the pelvis including uterus, ovaries, adnexal regions, and pelvic cul-de-sac. It was necessary to proceed with endovaginal exam following the transabdominal exam to visualize the endometrium and ovaries to better advantage. COMPARISON: None FINDINGS: Uterus Measurements: 9.4 cm x 4.4 cm x 4.6 cm. An exophytic mildly hypoechoic fibroid protrudes from the uterine fundus measuring 17 mm x 14 mm x 20 mm. There is a small mural fibroid along the anterior upper uterine segment measuring 8 mm in greatest dimension. No other uterine masses. Endometrium Thickness: 7 mm. No focal abnormality visualized. Right ovary Measurements: 4.8 cm x 3.2 cm x 2.2 cm. Dominant, 2.6 cm, simple appearing cyst. Ovary otherwise unremarkable. No adnexal masses. Left ovary Not visualized. No left adnexal mass. Other findings No free fluid. IMPRESSION: 1. Two uterine fibroids. Uterus otherwise unremarkable. Neither fibroid affects the endometrium. 2. Endometrium is normal thickness and smooth. 3. No findings to explain abnormal uterine bleeding. 4. Left ovary not visualized. No left adnexal mass. Normal right ovary with a dominant follicular cyst. Electronically Signed By: Lajean Manes M.D. On: 09/04/2013 22:27  US Pelvis Complete  09/04/2013 CLINICAL DATA: Abnormal uterine bleeding. EXAM: TRANSABDOMINAL AND TRANSVAGINAL  ULTRASOUND OF PELVIS TECHNIQUE: Both transabdominal and transvaginal ultrasound examinations of the pelvis were performed. Transabdominal technique was performed for global imaging of the pelvis including uterus, ovaries, adnexal regions, and pelvic cul-de-sac. It was necessary to proceed with endovaginal exam following the transabdominal exam to visualize the endometrium and ovaries to better advantage. COMPARISON: None FINDINGS: Uterus Measurements: 9.4 cm x 4.4 cm x 4.6 cm. An exophytic mildly hypoechoic fibroid protrudes from the uterine fundus measuring 17 mm x 14 mm x 20 mm. There is a small mural fibroid along the anterior upper uterine segment measuring 8 mm in greatest dimension. No other uterine masses. Endometrium Thickness: 7 mm. No focal abnormality visualized. Right ovary Measurements: 4.8 cm x 3.2 cm x 2.2 cm. Dominant, 2.6 cm, simple appearing cyst. Ovary otherwise unremarkable. No adnexal masses. Left ovary Not visualized. No left adnexal mass. Other findings No free fluid. IMPRESSION: 1. Two uterine fibroids. Uterus otherwise unremarkable. Neither fibroid affects the endometrium. 2. Endometrium is normal thickness and smooth. 3. No findings to explain abnormal uterine bleeding. 4. Left ovary not visualized. No left adnexal mass. Normal right ovary with a dominant follicular cyst. Electronically Signed By: Lajean Manes M.D. On: 09/04/2013 22:27   MDM  Orthostatic VS normal.  2130- Pelvic exam completed. Pt declines pain meds at this time.   Assessment and Plan   A:  1.  Abnormal uterine bleeding (AUB)   2.  Uterine fibroid    P:  Discharge home  F[facility] with [facility] 6/3 @ 3pm for endometrial biopsy  Rx for Provera sent to patient's pharmacy  Discussed reasons to return to MAU  Continue to take iron supplements   Maren Beach, Student-NP  09/04/2013, 10:34 PM   I have seen and evaluated the patient with the NP student. I agree with the assessment and plan as written above.   Farris Has,  PA-C  09/05/2013 1:08 AM

## 2013-09-29 ENCOUNTER — Ambulatory Visit (INDEPENDENT_AMBULATORY_CARE_PROVIDER_SITE_OTHER): Payer: No Typology Code available for payment source | Admitting: Obstetrics and Gynecology

## 2013-09-29 ENCOUNTER — Encounter: Payer: Self-pay | Admitting: Obstetrics and Gynecology

## 2013-09-29 ENCOUNTER — Other Ambulatory Visit (HOSPITAL_COMMUNITY)
Admission: RE | Admit: 2013-09-29 | Discharge: 2013-09-29 | Disposition: A | Discharge status: Home / Self Care | Payer: No Typology Code available for payment source | Source: Ambulatory Visit | Attending: Obstetrics and Gynecology | Admitting: Obstetrics and Gynecology

## 2013-09-29 VITALS — BP 163/102 | HR 91 | Temp 98.6°F | Ht 67.0 in | Wt 256.0 lb

## 2013-09-29 DIAGNOSIS — N939 Abnormal uterine and vaginal bleeding, unspecified: Secondary | ICD-10-CM

## 2013-09-29 DIAGNOSIS — Z01812 Encounter for preprocedural laboratory examination: Secondary | ICD-10-CM

## 2013-09-29 DIAGNOSIS — N926 Irregular menstruation, unspecified: Secondary | ICD-10-CM | POA: Insufficient documentation

## 2013-09-29 DIAGNOSIS — N840 Polyp of corpus uteri: Secondary | ICD-10-CM | POA: Diagnosis not present

## 2013-09-29 DIAGNOSIS — Z01818 Encounter for other preprocedural examination: Secondary | ICD-10-CM | POA: Diagnosis present

## 2013-09-29 LAB — POCT PREGNANCY, URINE: Preg Test, Ur: NEGATIVE

## 2013-09-29 NOTE — Progress Notes (Signed)
Patient ID: Brenda [name], female   DOB: Jul 31, 1970, 43 y.o.   MRN: [id number] 43 yo with abnormal uterine bleeding presenting today to discuss results of endometrial biopsy performed on 6/3. Unfortunately, the biopsy sample was insufficient for full analysis. Patient was informed of need for repeat endometrial biopsy and agrees. Patient reports taking the provera not consistently and describes intermittent vaginal bleeding with some days being light and others with a heavier flow.   A/P 43 yo with abnormal uterine bleeding - Repeat endometrial biopsy ENDOMETRIAL BIOPSY     The indications for endometrial biopsy were reviewed.   Risks of the biopsy including cramping, bleeding, infection, uterine perforation, inadequate specimen and need for additional procedures  were discussed. The patient states she understands and agrees to undergo procedure today. Consent was signed. Time out was performed. Urine HCG was negative. A sterile speculum was placed in the patient's vagina and the cervix was prepped with Betadine. A single-toothed tenaculum was placed on the anterior lip of the cervix to stabilize it. The uterine cavity was sounded to a depth of 10 cm using the uterine sound. The 3 mm pipelle was introduced into the endometrial cavity without difficulty, 2 passes were made.  A  moderate amount of tissue was  sent to pathology. The instruments were removed from the patient's vagina. Minimal bleeding from the cervix was noted. The patient tolerated the procedure well.  Routine post-procedure instructions were given to the patient. The patient will follow up in two weeks to review the results and for further management.   - Discussed medical management options and patient opted for Mirena IUD - RTC in 2 weeks for results and Mirena IUD insertion - Patient to continue daily provera in the meantime

## 2013-10-03 ENCOUNTER — Telehealth: Payer: Self-pay | Admitting: General Practice

## 2013-10-03 NOTE — Telephone Encounter (Signed)
Message copied by Shelly Coss on Tue Oct 03, 2013 11:45 AM ------      Message from: CONSTANT, [place]      Created: Mon Oct 02, 2013  3:31 PM       Please inform patient that endometrial biopsy was negative for cancer but demonstrates presence of endometrial polyp. Patient should keep follow up appointment for management plan ------

## 2013-10-03 NOTE — Telephone Encounter (Signed)
Called patient, no answer- left message that we are trying to reach you with some results, nothing urgent but please call us back at the clinics

## 2013-10-04 NOTE — Telephone Encounter (Signed)
Called patient, no answer- left message that we are trying to reach you with some results, nothing urgent but please call us back at the clinics

## 2013-10-04 NOTE — Telephone Encounter (Signed)
Patient called back to front office requesting call back at work. Called patient at work and informed her of results. Patient verbalized understanding and stated she would see [place] in August for that appt. Patient had no questions

## 2013-11-01 ENCOUNTER — Observation Stay (HOSPITAL_COMMUNITY)
Admission: EM | Admit: 2013-11-01 | Discharge: 2013-11-02 | Disposition: A | Discharge status: Home / Self Care | Payer: No Typology Code available for payment source | Attending: Internal Medicine | Admitting: Internal Medicine

## 2013-11-01 ENCOUNTER — Emergency Department (HOSPITAL_COMMUNITY): Payer: No Typology Code available for payment source

## 2013-11-01 ENCOUNTER — Encounter (HOSPITAL_COMMUNITY): Payer: Self-pay | Admitting: Emergency Medicine

## 2013-11-01 DIAGNOSIS — E669 Obesity, unspecified: Secondary | ICD-10-CM | POA: Insufficient documentation

## 2013-11-01 DIAGNOSIS — N840 Polyp of corpus uteri: Secondary | ICD-10-CM | POA: Insufficient documentation

## 2013-11-01 DIAGNOSIS — R0602 Shortness of breath: Secondary | ICD-10-CM | POA: Diagnosis not present

## 2013-11-01 DIAGNOSIS — D649 Anemia, unspecified: Secondary | ICD-10-CM

## 2013-11-01 DIAGNOSIS — D509 Iron deficiency anemia, unspecified: Secondary | ICD-10-CM | POA: Diagnosis not present

## 2013-11-01 DIAGNOSIS — N926 Irregular menstruation, unspecified: Secondary | ICD-10-CM | POA: Insufficient documentation

## 2013-11-01 DIAGNOSIS — D259 Leiomyoma of uterus, unspecified: Secondary | ICD-10-CM | POA: Insufficient documentation

## 2013-11-01 DIAGNOSIS — Z6839 Body mass index (BMI) 39.0-39.9, adult: Secondary | ICD-10-CM | POA: Insufficient documentation

## 2013-11-01 DIAGNOSIS — N939 Abnormal uterine and vaginal bleeding, unspecified: Secondary | ICD-10-CM | POA: Insufficient documentation

## 2013-11-01 DIAGNOSIS — Z9289 Personal history of other medical treatment: Secondary | ICD-10-CM

## 2013-11-01 HISTORY — DX: Headache: R51

## 2013-11-01 HISTORY — DX: Irregular menstruation, unspecified: N92.6

## 2013-11-01 HISTORY — DX: Personal history of other medical treatment: Z92.89

## 2013-11-01 HISTORY — DX: Anemia, unspecified: D64.9

## 2013-11-01 HISTORY — DX: Cardiac murmur, unspecified: R01.1

## 2013-11-01 LAB — CBC
HEMATOCRIT: 21.8 % — AB (ref 36.0–46.0)
Hemoglobin: 6.3 g/dL — CL (ref 12.0–15.0)
MCH: 21.4 pg — AB (ref 26.0–34.0)
MCHC: 28.9 g/dL — AB (ref 30.0–36.0)
MCV: 74.1 fL — ABNORMAL LOW (ref 78.0–100.0)
Platelets: 196 10*3/uL (ref 150–400)
RBC: 2.94 MIL/uL — ABNORMAL LOW (ref 3.87–5.11)
RDW: 17.2 % — AB (ref 11.5–15.5)
WBC: 7.1 10*3/uL (ref 4.0–10.5)

## 2013-11-01 LAB — ABO/RH: ABO/RH(D): A POS

## 2013-11-01 LAB — IRON AND TIBC
Iron: 51 ug/dL (ref 42–135)
Saturation Ratios: 10 % — ABNORMAL LOW (ref 20–55)
TIBC: 533 ug/dL — ABNORMAL HIGH (ref 250–470)
UIBC: 482 ug/dL — AB (ref 125–400)

## 2013-11-01 LAB — URINALYSIS, ROUTINE W REFLEX MICROSCOPIC
BILIRUBIN URINE: NEGATIVE
Glucose, UA: NEGATIVE mg/dL
HGB URINE DIPSTICK: NEGATIVE
Ketones, ur: NEGATIVE mg/dL
Leukocytes, UA: NEGATIVE
Nitrite: NEGATIVE
PROTEIN: NEGATIVE mg/dL
Specific Gravity, Urine: 1.014 (ref 1.005–1.030)
Urobilinogen, UA: 0.2 mg/dL (ref 0.0–1.0)
pH: 7.5 (ref 5.0–8.0)

## 2013-11-01 LAB — I-STAT TROPONIN, ED: Troponin i, poc: 0 ng/mL (ref 0.00–0.08)

## 2013-11-01 LAB — BASIC METABOLIC PANEL
Anion gap: 14 (ref 5–15)
BUN: 11 mg/dL (ref 6–23)
CALCIUM: 8.5 mg/dL (ref 8.4–10.5)
CO2: 24 mEq/L (ref 19–32)
Chloride: 101 mEq/L (ref 96–112)
Creatinine, Ser: 0.89 mg/dL (ref 0.50–1.10)
GFR calc non Af Amer: 78 mL/min — ABNORMAL LOW (ref >90)
Glucose, Bld: 114 mg/dL — ABNORMAL HIGH (ref 70–99)
POTASSIUM: 4.1 meq/L (ref 3.7–5.3)
Sodium: 139 mEq/L (ref 137–147)

## 2013-11-01 LAB — CBC WITH DIFFERENTIAL/PLATELET
BASOS ABS: 0 10*3/uL (ref 0.0–0.1)
BASOS PCT: 0 % (ref 0–1)
EOS PCT: 1 % (ref 0–5)
Eosinophils Absolute: 0.1 10*3/uL (ref 0.0–0.7)
HCT: 25 % — ABNORMAL LOW (ref 36.0–46.0)
HEMOGLOBIN: 7.3 g/dL — AB (ref 12.0–15.0)
LYMPHS ABS: 2.5 10*3/uL (ref 0.7–4.0)
LYMPHS PCT: 27 % (ref 12–46)
MCH: 21.5 pg — ABNORMAL LOW (ref 26.0–34.0)
MCHC: 29.2 g/dL — ABNORMAL LOW (ref 30.0–36.0)
MCV: 73.7 fL — ABNORMAL LOW (ref 78.0–100.0)
Monocytes Absolute: 0.6 10*3/uL (ref 0.1–1.0)
Monocytes Relative: 6 % (ref 3–12)
Neutro Abs: 6 10*3/uL (ref 1.7–7.7)
Neutrophils Relative %: 66 % (ref 43–77)
Platelets: 197 10*3/uL (ref 150–400)
RBC: 3.39 MIL/uL — AB (ref 3.87–5.11)
RDW: 17.1 % — AB (ref 11.5–15.5)
WBC: 9.2 10*3/uL (ref 4.0–10.5)

## 2013-11-01 LAB — FERRITIN: Ferritin: 3 ng/mL — ABNORMAL LOW (ref 10–291)

## 2013-11-01 LAB — PREGNANCY, URINE: Preg Test, Ur: NEGATIVE

## 2013-11-01 LAB — TROPONIN I: Troponin I: <0.3 ng/mL (ref <0.30)

## 2013-11-01 LAB — PRO B NATRIURETIC PEPTIDE: Pro B Natriuretic peptide (BNP): 61.4 pg/mL (ref 0–125)

## 2013-11-01 LAB — POC OCCULT BLOOD, ED: Fecal Occult Bld: NEGATIVE

## 2013-11-01 LAB — PREPARE RBC (CROSSMATCH)

## 2013-11-01 MED ORDER — ONDANSETRON HCL 4 MG/2ML IJ SOLN
4.0000 mg | Freq: Four times a day (QID) | INTRAMUSCULAR | Status: DC | PRN
  Administered 2013-11-02: 4 mg via INTRAVENOUS
  Filled 2013-11-01: qty 2

## 2013-11-01 MED ORDER — SODIUM CHLORIDE 0.9 % IJ SOLN
3.0000 mL | Freq: Two times a day (BID) | INTRAMUSCULAR | Status: DC
  Administered 2013-11-01: 3 mL via INTRAVENOUS

## 2013-11-01 MED ORDER — ACETAMINOPHEN 325 MG PO TABS
650.0000 mg | ORAL_TABLET | Freq: Four times a day (QID) | ORAL | Status: DC | PRN
  Administered 2013-11-01: 650 mg via ORAL
  Filled 2013-11-01: qty 2

## 2013-11-01 MED ORDER — ONDANSETRON HCL 4 MG PO TABS: 4.0000 mg | ORAL_TABLET | Freq: Four times a day (QID) | ORAL | Status: DC | PRN

## 2013-11-01 MED ORDER — MEGESTROL ACETATE 40 MG PO TABS
40.0000 mg | ORAL_TABLET | Freq: Two times a day (BID) | ORAL | Status: DC
  Administered 2013-11-01: 40 mg via ORAL
  Filled 2013-11-01 (×3): qty 1

## 2013-11-01 MED ORDER — MEGESTROL ACETATE 40 MG PO TABS
40.0000 mg | ORAL_TABLET | Freq: Every day | ORAL | Status: DC
  Filled 2013-11-01: qty 1

## 2013-11-01 NOTE — ED Provider Notes (Signed)
Medical screening examination/treatment/procedure(s) were conducted as a shared visit with non-physician practitioner(s) and myself.  I personally evaluated the patient during the encounter.   EKG Interpretation   Date/Time:  Wednesday November 01 2013 07:09:21 EDT Ventricular Rate:  97 PR Interval:  144 QRS Duration: 70 QT Interval:  350 QTC Calculation: 444 R Axis:   62 Text Interpretation:  Sinus rhythm with Premature supraventricular  complexes Cannot rule out Anterior infarct , age undetermined Abnormal ECG  No prior for comparison Confirmed by Marina Boerner  MD, Monty Mccarrell ([id number]) on  11/01/2013 9:58:14 AM      See additional note.  Merryl Hacker, MD 11/01/13 [phone]

## 2013-11-01 NOTE — Progress Notes (Signed)
Called Silas [place] for report she will call me back.

## 2013-11-01 NOTE — ED Notes (Signed)
Pt reports she normally wakes up nauseated in the AM. States nausea usually passes after sitting still after waking in the mornings but this AM it didn't. Pt also reports she was having fluttering sensation in her chest and came to ED. Reports no PCP, denies hx of heart or HBP. Pt educated on importance of systolic bp controlled under 140. Pt verbalized understanding for need for PCP and bp control.

## 2013-11-01 NOTE — ED Notes (Signed)
Pt reports having palpitations and fluttering x 2-3 months but it has become more severe and now having nausea and sob.

## 2013-11-01 NOTE — H&P (Signed)
Date: 11/01/2013               Patient Name:  Brenda [name] MRN: [id number]  DOB: 27-Jun-1970 Age / Sex: 43 y.o., female   PCP: No primary provider on file.         Medical Service: Internal Medicine Teaching Service         Attending Physician: Dr. Karren Cobble, MD    First Contact: Dr. Julious Oka Pager: [phone]  Second Contact: Dr. Jerene Pitch Pager: [phone]       After Hours (After 5p/  First Contact Pager: [phone]  weekends / holidays): Second Contact Pager: [phone]   Chief Complaint: palpitations, with worsening DOE and fatigue  History of Present Illness:  Pt is a 43 yo with PMH of menorrhagia with intermenstrual bleeding presents with progressively worsening of fatigue with DOE, and acute onset heart palpitations.  She states that over the past 2-3 months she has been experiencing menstrual bleeding almost daily. She states that she will bleed 3 out of 4 weeks. The bleeding is heavy at times but sometimes will be lighter. She states that she has not had vaginal bleeding for the past week, since last Tuesday, 7/21. With this irregular vaginal bleeding, she endorses increased fatigue and dyspnea on exertion that have progressively worsened. She states that she used to get winded walking from the parking lot into work, but now she is winded just getting dressed. She states that this morning she began to experience heart palpitations and decided to come to the ED.   She is followed by her gynecologist, Dr. Elly Modena at [facility] and has been on Provera without much improvement in her bleeding. Per the patient, she had similar bleeding in '04 attributed to endometrial polyps that resolved after a D&C, but then in May '15 her symptoms returned. The pt states that she saw her GYN in June and a transvaginal u/s was performed which revealed uterine fibroids. An endometrial biopsy was performed during that visit which was negative for cancer but was positive for the presence  of an endometrial polyp, and was told to return in 2 weeks to discuss IUD insertion, per medical record. The patient states that the earliest appt she could get with her GYN was in August, so she has not been back to the GYN since the end of June.    Meds: No current facility-administered medications for this encounter.   Current Outpatient Prescriptions  Medication Sig Dispense Refill  . ferrous fumarate (HEMOCYTE - 106 MG FE) 325 (106 FE) MG TABS tablet Take 1 tablet by mouth 3 (three) times daily.        Allergies: Allergies as of 11/01/2013  . (No Known Allergies)   Past Medical History  Diagnosis Date  . Endometrial polyp 2004    "polyps around the womb"   Past Surgical History  Procedure Laterality Date  . Dilation and curettage of uterus  2004    for endometrial polyp  . Cesarean section     Family History  Problem Relation Age of Onset  . Hypertension Maternal Grandmother    History   Social History  . Marital Status: Single    Spouse Name: N/A    Number of Children: N/A  . Years of Education: N/A   Occupational History  . Not on file.   Social History Main Topics  . Smoking status: Never Smoker   . Smokeless tobacco: Never Used  . Alcohol Use: No  . Drug  Use: No  . Sexual Activity: Yes    Birth Control/ Protection: None   Other Topics Concern  . Not on file   Social History Narrative  . No narrative on file    Review of Systems: A 12 point ROS was performed; pertinent positives and negatives are noted below  Constitutional: Denies fever, chills, appetite change. +fatigue.  Respiratory: +SOB, DOE. Denies cough or wheezing.  Cardiovascular: Denies chest pain. +Palpitations and intermittent leg swelling.  Gastrointestinal: +nausea. Denies vomiting, abdominal pain, diarrhea, constipation, blood in stool or abdominal distention.  Genitourinary: Denies dysuria, urgency, frequency  Musculoskeletal: Denies myalgias or arthralgias.  Skin: Denies rash  or wound.  Neurological: Denies dizziness, syncope, weakness or numbness.  Psychiatric/Behavioral: Denies suicidal ideation, mood changes, confusion   Physical Exam: Blood pressure 154/82, pulse 95, temperature 98.9 F (37.2 C), temperature source Oral, resp. rate 17, height 5\' 7"  (1.702 m), weight 245 lb (111.131 kg), last menstrual period 10/11/2013, SpO2 100.00%. General: Alert & oriented x 3, well-developed, and cooperative on examination.  Head: Normocephalic and atraumatic.  Eyes: Vision grossly intact, pupils equal, round, and reactive to light, no injection and anicteric.  Mouth: Pharynx pink and moist, no erythema or exudates.  Neck: Supple, full ROM  Lungs: CTAB, normal respiratory effort, no accessory muscle use, no crackles, and no wheezes. Heart: Tachycardic, regular rhythm, no murmur, no gallop, and no rub.  Abdomen: Soft, non-tender, non-distended, normal bowel sounds, no guarding, no rebound tenderness, no organomegaly.  Msk: No joint swelling, warmth, or erythema.  Extremities: 2+ radial and DP pulses bilaterally. No cyanosis, clubbing. Trace edema in BLE. Neurologic: Alert & oriented X3, cranial nerves II-XII intact, strength 5/5 in all extremities Skin: Turgor normal and no rashes.  Psych: Normal mood and affect. Memory intact for recent and remote, normally interactive, good eye contact, not anxious appearing, and not depressed appearing.   Lab results: Basic Metabolic Panel:  Recent Labs  11/01/13 0734  NA 139  K 4.1  CL 101  CO2 24  GLUCOSE 114*  BUN 11  CREATININE 0.89  CALCIUM 8.5   CBC:  Recent Labs  11/01/13 0734  WBC 7.1  HGB 6.3*  HCT 21.8*  MCV 74.1*  PLT 196   Cardiac Enzymes: 11/01/13: i-stat troponin 0.00  BNP:  Recent Labs  11/01/13 0735  PROBNP 61.4    Urinalysis:  Recent Labs  11/01/13 0909  COLORURINE YELLOW  LABSPEC 1.014  PHURINE 7.5  GLUCOSEU NEGATIVE  HGBUR NEGATIVE  BILIRUBINUR NEGATIVE  KETONESUR NEGATIVE   PROTEINUR NEGATIVE  UROBILINOGEN 0.2  NITRITE NEGATIVE  LEUKOCYTESUR NEGATIVE     Imaging results:  Dg Chest 2 View  11/01/2013   CLINICAL DATA:  Shortness of breath, nausea, palpitations  EXAM: CHEST  2 VIEW  COMPARISON:  07/09/2008  FINDINGS: Borderline cardiomegaly with vascular congestion centrally. No focal pneumonia, collapse or consolidation. Negative for edema, effusion or pneumothorax. Trachea is midline. no acute osseous finding.  IMPRESSION: Borderline cardiomegaly with vascular congestion.   Electronically Signed   By: Daryll Brod M.D.   On: 11/01/2013 08:22    Other results: EKG: Sinus tachycardia. Normal axis. No ST abnormalities. No previous EKG for comparison  Assessment & Plan by Problem: Pt is a 43 yo with PMH of menorrhagia with intermenstrual bleeding presents with progressively worsening of fatigue with DOE, and acute onset heart palpitations.  Symptomatic anemia in the setting of abnormal uterine bleeding: Pt with heavy menses with intramenstrual bleeding. Fibroid seen on transvaginal u/s and  confirmed on endometrial biopsy. Pt supposed to f/u with GYN this month, but could not get an appt until August to discuss therapy options, including IUD placement vs hysterectomy. She presents today with progressive worsening of SOB with DOE and fatigue, and has now developed palpitations. In the ED, hgb 6.3, i-stat troponin negative, and EKG without changes to suggest ischemia. FOBT negative, so anemia is likely 2/2 vaginal bleeding.  - Admit to IMTS to tele - Transfuse 2u pRBC (started in ED), after #1, check CBC and volume status - CBC post transfusion and in am - Consult GYN - Zofran PRN - Regular diet  Vascular congestion on CXR: Central pulmonary edema seen on CXR. Pt does endorse intermittent peripheral edema. No h/o CHF, but pt has not had an ECHO previously. However, ProBNP 61.4 on admission. Currently, the patient sounds clear on exam and denies any respiratory  distress at rest. Transfusing 1-2 u pRBCs and will need to monitor for volume overload and lung injury. If her breathing worsens or if she develops crackles on exam, will consider getting TTE to assess cardiac function, as the ProBNP can be falsely lower in obese patients; this patient has a BMI of 39.2.  DVT PPx: SCDs in the setting of anemia   Dispo: Disposition is deferred at this time, awaiting improvement of current medical problems. Anticipated discharge in approximately 1-2 day(s).   The patient does not have a current PCP (No primary provider on file.) and may need an [facility] hospital follow-up appointment after discharge.  The patient does not have transportation limitations that hinder transportation to clinic appointments.  Signed: Otho Bellows, MD 11/01/2013, 10:53 AM

## 2013-11-01 NOTE — ED Provider Notes (Signed)
Medical screening examination/treatment/procedure(s) were conducted as a shared visit with non-physician practitioner(s) and myself.  I personally evaluated the patient during the encounter.   EKG Interpretation   Date/Time:  Wednesday November 01 2013 07:09:21 EDT Ventricular Rate:  97 PR Interval:  144 QRS Duration: 70 QT Interval:  350 QTC Calculation: 444 R Axis:   62 Text Interpretation:  Sinus rhythm with Premature supraventricular  complexes Cannot rule out Anterior infarct , age undetermined Abnormal ECG  No prior for comparison Confirmed by HORTON  MD, Loma Sousa ([id number]) on  11/01/2013 9:58:14 AM      Patient presents with nausea and dyspnea on exertion. Patient reports worsening dyspnea on exertion over the last month. Recent history of vaginal bleeding. She is followed by her GYN and was told she had uterine polyps. Patient reports heavy vaginal bleeding x3 weeks followed by approximately one week of no flow. Currently patient is not vaginally bleeding. She states when she does bleed she passes large clots. Patient also reports heart palpitations. Vital signs are stable at this time. She is not short of breath at rest. Exam is largely reassuring with clear breath sounds and normal rate and rhythm. EKG is nonischemic.  Given age and history of vaginal bleeding, suspect possible underlying anemia.  Workup notable for negative troponin.  Hemoglobin is 6.3 down from 9.4 one month ago. This is likely the cause of the patient's nausea and shortness of breath. Patient was typed and screened and given her symptoms, will transfuse one unit of packed red cells. Patient will be admitted for further workup and management.  CRITICAL CARE Performed by: Thayer Jew, F   Total critical care time: 35 min  Critical care time was exclusive of separately billable procedures and treating other patients.  Critical care was necessary to treat or prevent imminent or life-threatening  deterioration.  Critical care was time spent personally by me on the following activities: development of treatment plan with patient and/or surrogate as well as nursing, discussions with consultants, evaluation of patient's response to treatment, examination of patient, obtaining history from patient or surrogate, ordering and performing treatments and interventions, ordering and review of laboratory studies, ordering and review of radiographic studies, pulse oximetry and re-evaluation of patient's condition.   Merryl Hacker, MD 11/01/13 1002

## 2013-11-01 NOTE — ED Notes (Signed)
Attempted report 

## 2013-11-01 NOTE — Progress Notes (Signed)
NURSING PROGRESS NOTE  Brenda [name] [id number] Admission Data: 11/01/2013 6:16 PM Attending Provider: Karren Cobble, MD SKA:[id number],IOMBT, MD Code Status: FULL  Brenda [name] is a 43 y.o. female patient admitted from ED:  -No acute distress noted.  -No complaints of shortness of breath.  -No complaints of chest pain.    Blood pressure 166/98, pulse 98, temperature 99.2 F (37.3 C), temperature source Oral, resp. rate 20, height 5\' 7"  (1.702 m), weight 113.399 kg (250 lb), last menstrual period 10/11/2013, SpO2 97.00%.   IV Fluids:  IV in place, occlusive dsg intact without redness, IV cath antecubital left, condition patent and no redness   Allergies:  Review of patient's allergies indicates no known allergies.  Past Medical History:   has a past medical history of Endometrial polyp (2004); Heart murmur; Anemia; Abnormal menstrual cycle; History of blood transfusion (11/01/2013); and Headache(784.0).  Past Surgical History:   has past surgical history that includes Cesarean section (1994); Dilation and curettage of uterus (2004); and Tubal ligation (1994).  Skin: intact. Tattoos noted.  Patient/Family orientated to room. Information packet given to patient/family. Admission inpatient armband information verified with patient/family to include name and date of birth and placed on patient arm. Side rails up x 2, fall assessment and education completed with patient/family. Patient/family able to verbalize understanding of risk associated with falls and verbalized understanding to call for assistance before getting out of bed. Call light within reach. Patient/family able to voice and demonstrate understanding of unit orientation instructions.    Will continue to evaluate and treat per MD orders.  Wallie Renshaw, RN

## 2013-11-01 NOTE — ED Provider Notes (Signed)
CSN: [id number]     Arrival date & time 11/01/13  0704 History   First MD Initiated Contact with Patient 11/01/13 0730     Chief Complaint  Patient presents with  . Shortness of Breath  . Palpitations     (Consider location/radiation/quality/duration/timing/severity/associated sxs/prior Treatment) The history is provided by the patient and medical records.    Patient with ongoing treatment for heavy vaginal bleeding from polyps and fibroids p/w intermittent palpitations, nausea, and SOB.  Has palpitations, defined as "fluttering" that occurs only in the mornings when she wakes up.  Today was "worse," because it has lasted longer - fluttering lasts approximately 60 seconds when it occurs.  Has had nausea every morning for 2-3 weeks, usually goes away after brushing teeth, but not always.  Has had 2-3 months of SOB with exertion, worse this morning.  Usually cannot walk from parking lot to building with SOB, today could not get dressed without taking a break to catch her breath.  Palpitations do not occur with exertion.  Denies CP, cough, leg swelling, syncope, lightheadedness, dizziness.  No active vaginal bleeding.  Denies current palpitations.  Denies hx indigestion or reflux symptoms.  Denies family hx CAD.    Past Medical History  Diagnosis Date  . Endometrial polyp 2004    "polyps around the womb"   Past Surgical History  Procedure Laterality Date  . Dilation and curettage of uterus  2004    for endometrial polyp  . Cesarean section     Family History  Problem Relation Age of Onset  . Hypertension Maternal Grandmother    History  Substance Use Topics  . Smoking status: Never Smoker   . Smokeless tobacco: Never Used  . Alcohol Use: No   OB History   Grav Para Term Preterm Abortions TAB SAB Ect Mult Living   2 2 2      1 3      Review of Systems  All other systems reviewed and are negative.     Allergies  Review of patient's allergies indicates no known  allergies.  Home Medications   Prior to Admission medications   Medication Sig Start Date End Date Taking? Authorizing Provider  ferrous fumarate (HEMOCYTE - 106 MG FE) 325 (106 FE) MG TABS tablet Take 1 tablet by mouth 3 (three) times daily.   Yes Historical Provider, MD   BP 154/87  Pulse 104  Temp(Src) 98.4 F (36.9 C) (Oral)  Resp 25  Ht 5\' 7"  (1.702 m)  Wt 245 lb (111.131 kg)  BMI 38.36 kg/m2  SpO2 99% Physical Exam  Nursing note and vitals reviewed. Constitutional: She appears well-developed and well-nourished. No distress.  HENT:  Head: Normocephalic and atraumatic.  Neck: Neck supple.  Cardiovascular: Normal rate and regular rhythm.   Pulmonary/Chest: Effort normal and breath sounds normal. No respiratory distress. She has no wheezes. She has no rales.  Abdominal: Soft. She exhibits no distension. There is no tenderness. There is no rebound and no guarding.  Neurological: She is alert.  Skin: She is not diaphoretic.    ED Course  Procedures (including critical care time) Labs Review Labs Reviewed  CBC - Abnormal; Notable for the following:    RBC 2.94 (*)    Hemoglobin 6.3 (*)    HCT 21.8 (*)    MCV 74.1 (*)    MCH 21.4 (*)    MCHC 28.9 (*)    RDW 17.2 (*)    All other components within normal limits  BASIC  METABOLIC PANEL - Abnormal; Notable for the following:    Glucose, Bld 114 (*)    GFR calc non Af Amer 78 (*)    All other components within normal limits  PRO B NATRIURETIC PEPTIDE  URINALYSIS, ROUTINE W REFLEX MICROSCOPIC  PREGNANCY, URINE  CBC WITH DIFFERENTIAL  TROPONIN I  IRON AND TIBC  FERRITIN  I-STAT TROPOININ, ED  POC OCCULT BLOOD, ED  TYPE AND SCREEN  ABO/RH  PREPARE RBC (CROSSMATCH)  PREPARE RBC (CROSSMATCH)    Imaging Review Dg Chest 2 View  11/01/2013   CLINICAL DATA:  Shortness of breath, nausea, palpitations  EXAM: CHEST  2 VIEW  COMPARISON:  07/09/2008  FINDINGS: Borderline cardiomegaly with vascular congestion centrally. No  focal pneumonia, collapse or consolidation. Negative for edema, effusion or pneumothorax. Trachea is midline. no acute osseous finding.  IMPRESSION: Borderline cardiomegaly with vascular congestion.   Electronically Signed   By: Daryll Brod M.D.   On: 11/01/2013 08:22     EKG Interpretation   Date/Time:  Wednesday November 01 2013 07:09:21 EDT Ventricular Rate:  97 PR Interval:  144 QRS Duration: 70 QT Interval:  350 QTC Calculation: 444 R Axis:   62 Text Interpretation:  Sinus rhythm with Premature supraventricular  complexes Cannot rule out Anterior infarct , age undetermined Abnormal ECG  No prior for comparison Confirmed by Dina Rich  MD, Loma Sousa ([id number]) on  11/01/2013 9:58:14 AM      9:28 AM Admitted to Internal Medicine Teaching Service.  MDM   Final diagnoses:  Symptomatic anemia    Pt with hx frequent vaginal bleeding from fibroids p/w DOE, palpitations, nausea.   Hgb 6.3.  Pt denies any current vaginal bleeding.  Type and Cross ordered, 2 units PRBC ordered.  Pt advised of results and plan - verbalizes understanding and agrees with plan.  Admitted to [facility] Internal Medicine Teaching service.      Clayton Bibles, PA-C 11/01/13 1456

## 2013-11-02 ENCOUNTER — Observation Stay (HOSPITAL_COMMUNITY): Payer: No Typology Code available for payment source

## 2013-11-02 DIAGNOSIS — J811 Chronic pulmonary edema: Secondary | ICD-10-CM

## 2013-11-02 DIAGNOSIS — N938 Other specified abnormal uterine and vaginal bleeding: Secondary | ICD-10-CM

## 2013-11-02 DIAGNOSIS — D649 Anemia, unspecified: Secondary | ICD-10-CM

## 2013-11-02 DIAGNOSIS — N949 Unspecified condition associated with female genital organs and menstrual cycle: Secondary | ICD-10-CM

## 2013-11-02 LAB — BASIC METABOLIC PANEL
ANION GAP: 14 (ref 5–15)
BUN: 11 mg/dL (ref 6–23)
CALCIUM: 8.9 mg/dL (ref 8.4–10.5)
CHLORIDE: 101 meq/L (ref 96–112)
CO2: 24 meq/L (ref 19–32)
Creatinine, Ser: 0.85 mg/dL (ref 0.50–1.10)
GFR calc Af Amer: >90 mL/min (ref >90)
GFR calc non Af Amer: 83 mL/min — ABNORMAL LOW (ref >90)
Glucose, Bld: 106 mg/dL — ABNORMAL HIGH (ref 70–99)
Potassium: 4.3 mEq/L (ref 3.7–5.3)
Sodium: 139 mEq/L (ref 137–147)

## 2013-11-02 LAB — CBC
HCT: 25.4 % — ABNORMAL LOW (ref 36.0–46.0)
Hemoglobin: 7.5 g/dL — ABNORMAL LOW (ref 12.0–15.0)
MCH: 21.9 pg — ABNORMAL LOW (ref 26.0–34.0)
MCHC: 29.5 g/dL — ABNORMAL LOW (ref 30.0–36.0)
MCV: 74.3 fL — ABNORMAL LOW (ref 78.0–100.0)
PLATELETS: 225 10*3/uL (ref 150–400)
RBC: 3.42 MIL/uL — AB (ref 3.87–5.11)
RDW: 17.2 % — AB (ref 11.5–15.5)
WBC: 8.9 10*3/uL (ref 4.0–10.5)

## 2013-11-02 MED ORDER — FERUMOXYTOL INJECTION 510 MG/17 ML
1020.0000 mg | Freq: Once | INTRAVENOUS | Status: AC
  Administered 2013-11-02: 1020 mg via INTRAVENOUS
  Filled 2013-11-02: qty 34

## 2013-11-02 NOTE — Progress Notes (Signed)
Subjective: Pt states she feels the best she ever been since the past few months in regards to SOB and nausea. She has been ambulate to bathroom w/o SOB.   Objective: Vital signs in last 24 hours: Filed Vitals:   11/01/13 2000 11/01/13 2224 11/02/13 0000 11/02/13 0433  BP:  152/82  131/79  Pulse:  96  96  Temp:  98.7 F (37.1 C)  98.4 F (36.9 C)  TempSrc:  Oral  Oral  Resp: 19 18 16 16   Height:      Weight:      SpO2:  98%  98%   Weight change:   Intake/Output Summary (Last 24 hours) at 11/02/13 1020 Last data filed at 11/01/13 2213  Gross per 24 hour  Intake    447 ml  Output      0 ml  Net    447 ml   BP 131/79  Pulse 96  Temp(Src) 98.4 F (36.9 C) (Oral)  Resp 16  Ht 5\' 7"  (1.702 m)  Wt 250 lb (113.399 kg)  BMI 39.15 kg/m2  SpO2 98%  LMP 10/11/2013  General Appearance:    Alert, cooperative, no distress, no palor  Eyes:   conjunctival palor  Back:     Symmetric, no curvature, ROM normal, no CVA tenderness  Lungs:     Clear to auscultation bilaterally, respirations unlabored   Heart:    Regular rate and rhythm, flow murmur at rt sternal border  Abdomen:     Soft, non-tender, bowel sounds active  Neurologic:   CNII-XII  Grossly intact      Lab Results: Basic Metabolic Panel:  Recent Labs Lab 11/01/13 0734 11/02/13 0620  NA 139 139  K 4.1 4.3  CL 101 101  CO2 24 24  GLUCOSE 114* 106*  BUN 11 11  CREATININE 0.89 0.85  CALCIUM 8.5 8.9   CBC:  Recent Labs Lab 11/01/13 1435 11/02/13 0620  WBC 9.2 8.9  NEUTROABS 6.0  --   HGB 7.3* 7.5*  HCT 25.0* 25.4*  MCV 73.7* 74.3*  PLT 197 225   Cardiac Enzymes:  Recent Labs Lab 11/01/13 1435  TROPONINI <0.30   BNP:  Recent Labs Lab 11/01/13 0735  PROBNP 61.4   Anemia Panel:  Recent Labs Lab 11/01/13 1435  FERRITIN 3*  TIBC 533*  IRON 51   Urinalysis:  Recent Labs Lab 11/01/13 0909  COLORURINE YELLOW  LABSPEC 1.014  PHURINE 7.5  GLUCOSEU NEGATIVE  HGBUR NEGATIVE    BILIRUBINUR NEGATIVE  KETONESUR NEGATIVE  PROTEINUR NEGATIVE  UROBILINOGEN 0.2  NITRITE NEGATIVE  LEUKOCYTESUR NEGATIVE   Studies/Results: Dg Chest 2 View  11/02/2013   CLINICAL DATA:  Shortness of breath and pulmonary edema.  EXAM: CHEST  2 VIEW  COMPARISON:  11/01/2013  FINDINGS: Lungs are adequately inflated and otherwise clear. Cardiomediastinal silhouette and remainder of the exam is unchanged.  IMPRESSION: No active cardiopulmonary disease.   Electronically Signed   By: Marin Olp M.D.   On: 11/02/2013 08:24   Dg Chest 2 View  11/01/2013   CLINICAL DATA:  Shortness of breath, nausea, palpitations  EXAM: CHEST  2 VIEW  COMPARISON:  07/09/2008  FINDINGS: Borderline cardiomegaly with vascular congestion centrally. No focal pneumonia, collapse or consolidation. Negative for edema, effusion or pneumothorax. Trachea is midline. no acute osseous finding.  IMPRESSION: Borderline cardiomegaly with vascular congestion.   Electronically Signed   By: Daryll Brod M.D.   On: 11/01/2013 08:22   Medications: I have reviewed  the patient's current medications. Scheduled Meds: . megestrol  40 mg Oral BID  . sodium chloride  3 mL Intravenous Q12H   Continuous Infusions:  PRN Meds:.acetaminophen, ondansetron (ZOFRAN) IV, ondansetron Assessment/Plan:  Symptomatic anemia in the setting of abnormal uterine bleeding: Pt with heavy menses with intramenstrual bleeding. Fibroid seen on transvaginal u/s and confirmed on endometrial biopsy, In the ED, hgb 6.3, i-stat troponin negative, and EKG without changes to suggest ischemia. FOBT negative, so anemia is likely 2/2 vaginal bleeding.  - pt transfused 1 unit RBCs with hgb of 7.5 today and improvement of SOB - iron panel reveals ferritin 3, iron 51, and increased TIBC of 533. Will give IV feraheme today, discussed risks of IV transfusion with patient.  - pt is not actively bleeding, will d/c megace  - Pt has f/u w/ GYN to discuss IUD placement vs  hysterectomy in August  Vascular congestion on CXR: Central pulmonary edema seen on portable CXR. Pt does endorse intermittent peripheral edema. No h/o CHF,  ProBNP 61.4 on admission. No crackles on exam. Repeat 2 view this morning negative for acute abnormalities.   DVT PPx: SCDs in the setting of anemia   Dispo: Disposition is deferred at this time, awaiting improvement of current medical problems. Anticipated discharge in approximately 1-2 day(s).   The patient does not have a current PCP (No primary provider on file.) and may need an [facility] hospital follow-up appointment after discharge.   The patient does not have transportation limitations that hinder transportation to clinic appointments.    LOS: 1 day   Julious Oka, MD 11/02/2013, 10:20 AM

## 2013-11-02 NOTE — H&P (Signed)
Internal Medicine Attending Admission Note Date: 11/02/2013  Patient name: Brenda [name] Medical record number: [id number] Date of birth: [birth year] Age: 43 y.o. Gender: female  I saw and evaluated the patient. I reviewed the resident's note and I agree with the resident's findings and plan as documented in the resident's note.  Chief Complaint(s): Progressive dyspnea on exertion, palpitations, and generalized fatigue  History - key components related to admission:  Brenda [name] is a 43 year old woman with a history of menorrhagia with intermenstrual bleeding secondary to fibroids and intrauterine polyps who presents with a nearly 9 day history of progressive dyspnea on exertion, palpitations, and generalized fatigue. Over the last 3 months she has had menstrual bleeding nearly daily, although has had none for the last 5 days. She is followed by a gynecologist at the [facility] who has been prescribing Provera without much change in her menorrhagia. As her dyspnea progressed to the point of shortness of breath at rest she presented to the emergency department for further evaluation. She was found to have a hemoglobin under 7 and was admitted to the internal medicine teaching service for further evaluation and care.  On rounds this morning she was feeling much better. In fact, she states she has not felt this well in quite some time. Overnight she received one unit of pack with blood cells and her hemoglobin increased from 6.3-7.3. She is without other complaints and feels she is ready to go home.  Physical Exam - key components related to admission:  Filed Vitals:   11/02/13 0433 11/02/13 1320 11/02/13 1321 11/02/13 1425  BP: 131/79 196/98 162/92 151/86  Pulse: 96   94  Temp: 98.4 F (36.9 C)   98.6 F (37 C)  TempSrc: Oral   Oral  Resp: 16   18  Height:      Weight:      SpO2: 98%   100%   General: Well-developed, well-nourished, obese woman lying comfortably in bed in no acute  distress. Abdomen: Soft, nontender, without guarding or rebound.  Lab results:  Basic Metabolic Panel:  Recent Labs  11/01/13 0734 11/02/13 0620  NA 139 139  K 4.1 4.3  CL 101 101  CO2 24 24  GLUCOSE 114* 106*  BUN 11 11  CREATININE 0.89 0.85  CALCIUM 8.5 8.9   CBC:  Recent Labs  11/01/13 1435 11/02/13 0620  WBC 9.2 8.9  NEUTROABS 6.0  --   HGB 7.3* 7.5*  HCT 25.0* 25.4*  MCV 73.7* 74.3*  PLT 197 225   Cardiac Enzymes:  Recent Labs  11/01/13 1435  TROPONINI <0.30   Anemia Panel:  Recent Labs  11/01/13 1435  FERRITIN 3*  TIBC 533*  IRON 51   Urinalysis:  Unremarkable  Misc. Labs:  Urine pregnancy negative  Fecal occult blood test negative  Imaging results:  Dg Chest 2 View  11/02/2013   CLINICAL DATA:  Shortness of breath and pulmonary edema.  EXAM: CHEST  2 VIEW  COMPARISON:  11/01/2013  FINDINGS: Lungs are adequately inflated and otherwise clear. Cardiomediastinal silhouette and remainder of the exam is unchanged.  IMPRESSION: No active cardiopulmonary disease.   Electronically Signed   By: Marin Olp M.D.   On: 11/02/2013 08:24   Dg Chest 2 View  11/01/2013   CLINICAL DATA:  Shortness of breath, nausea, palpitations  EXAM: CHEST  2 VIEW  COMPARISON:  07/09/2008  FINDINGS: Borderline cardiomegaly with vascular congestion centrally. No focal pneumonia, collapse or consolidation. Negative for edema, effusion or  pneumothorax. Trachea is midline. no acute osseous finding.  IMPRESSION: Borderline cardiomegaly with vascular congestion.   Electronically Signed   By: Daryll Brod M.D.   On: 11/01/2013 08:22   Other results:  EKG: Normal sinus rhythm at 97 beats per minute, normal axis, normal intervals, no significant Q waves or LVH by voltage, good R wave progression, no ST segment changes, inferolateral T wave flattening.  Assessment & Plan by Problem:  Brenda [name] is a 43 year old woman with a history of menorrhagia with intermenstrual bleeding  secondary to fibroids and intrauterine polyps who presents with progressive dyspnea on exertion, palpitations, and fatigue secondary to symptomatic blood loss anemia in the setting of an underlying iron deficiency. She currently has no menstrual bleeding and is scheduled to followup with her gynecologist in 2 weeks. She symptomatically improved with one unit of packed red blood cells.  1) Progressive dyspnea on exertion: Secondary to symptomatic anemia related to blood loss and chronic iron deficiency. Her symptoms markedly improved with one unit of packed red blood cells and an increase in her hemoglobin of 1 g.  2) Iron deficiency anemia: Not only did she receive iron through a one unit packed red blood cell transfusion but she was also given IV iron to quickly increase her iron stores and allow her bone marrow to produce hemoglobin.  3) Disposition: She is symptomatically much improved after one unit packed red blood cell transfusion and is stable for discharge home with followup as already scheduled on August 14 with her gynecologist at the [facility].

## 2013-11-02 NOTE — Care Management Note (Signed)
    Page 1 of 1   11/02/2013     2:50:09 PM CARE MANAGEMENT NOTE 11/02/2013  Patient:  Brenda [name], Brenda [name]   Account Number:  [id number]  Date Initiated:  11/02/2013  Documentation initiated by:  Tomi Bamberger  Subjective/Objective Assessment:   dx palpitations, sob  admit- from home.     Action/Plan:   Anticipated DC Date:  11/02/2013   Anticipated DC Plan:  HOME/SELF CARE      DC Planning Services  CM consult      Choice offered to / List presented to:             Status of service:  Completed, signed off Medicare Important Message given?  NO (If response is "NO", the following Medicare IM given date fields will be blank) Date Medicare IM given:   Medicare IM given by:   Date Additional Medicare IM given:   Additional Medicare IM given by:    Discharge Disposition:  HOME/SELF CARE  Per UR Regulation:  Reviewed for med. necessity/level of care/duration of stay  If discussed at [place] of Stay Meetings, dates discussed:    Comments:

## 2013-11-02 NOTE — Progress Notes (Signed)
Utilization Review Completed.Donne Anon T7/30/2015

## 2013-11-02 NOTE — Progress Notes (Signed)
Internal Medicine Attending  Date: 11/02/2013  Patient name: Brenda [name] Medical record number: [id number] Date of birth: 10-26-1970 Age: 43 y.o. Gender: female  I saw and evaluated the patient. I developed the assessment and plan with the housestaff and reviewed the resident's note by Dr. Hulen Luster.  I agree with the resident's findings and plans as documented in her progress note.  Please see my addendum to the history and physical for my evaluation, assessment, and plan for today.

## 2013-11-02 NOTE — Progress Notes (Signed)
NURSING PROGRESS NOTE  Brenda [name] [id number] Discharge Data: 11/02/2013 6:10 PM Attending Provider: Karren Cobble, MD TXM:[email or web address],YQMGN, MD     [name] to be D/C'd Home per MD order.  Discussed with the patient and patient's sister the After Visit Summary and all questions fully answered. All IV's discontinued with no bleeding noted. All belongings returned to patient for patient to take home.   Last Vital Signs:  Blood pressure 151/86, pulse 94, temperature 98.6 F (37 C), temperature source Oral, resp. rate 18, height 5\' 7"  (1.702 m), weight 113.399 kg (250 lb), last menstrual period 10/11/2013, SpO2 100.00%.  Discharge Medication List   Medication List         ferrous fumarate 325 (106 FE) MG Tabs tablet  Commonly known as:  HEMOCYTE - 106 mg FE  Take 1 tablet by mouth 3 (three) times daily.         Wallie Renshaw, RN

## 2013-11-02 NOTE — Discharge Instructions (Signed)
Please continue to take your home iron pills three times a day. You will need to keep your appointment with Dr. Elly Modena on August 17th at 12:45pm.   Iron Deficiency Anemia Anemia is a condition in which there are less red blood cells or hemoglobin in the blood than normal. Hemoglobin is the part of red blood cells that carries oxygen. Iron deficiency anemia is anemia caused by too little iron. It is the most common type of anemia. It may leave you tired and short of breath. CAUSES   Lack of iron in the diet.  Poor absorption of iron, as seen with intestinal disorders.  Intestinal bleeding.  Heavy periods. SIGNS AND SYMPTOMS  Mild anemia may not be noticeable. Symptoms may include:  Fatigue.  Headache.  Pale skin.  Weakness.  Tiredness.  Shortness of breath.  Dizziness.  Cold hands and feet.  Fast or irregular heartbeat. DIAGNOSIS  Diagnosis requires a thorough evaluation and physical exam by your health care provider. Blood tests are generally used to confirm iron deficiency anemia. Additional tests may be done to find the underlying cause of your anemia. These may include:  Testing for blood in the stool (fecal occult blood test).  A procedure to see inside the colon and rectum (colonoscopy).  A procedure to see inside the esophagus and stomach (endoscopy). TREATMENT  Iron deficiency anemia is treated by correcting the cause of the deficiency. Treatment may involve:  Adding iron-rich foods to your diet.  Taking iron supplements. Pregnant or breastfeeding women need to take extra iron because their normal diet usually does not provide the required amount.  Taking vitamins. Vitamin C improves the absorption of iron. Your health care provider may recommend that you take your iron tablets with a glass of orange juice or vitamin C supplement.  Medicines to make heavy menstrual flow lighter.  Surgery. HOME CARE INSTRUCTIONS   Take iron as directed by your health  care provider.  If you cannot tolerate taking iron supplements by mouth, talk to your health care provider about taking them through a vein (intravenously) or an injection into a muscle.  For the best iron absorption, iron supplements should be taken on an empty stomach. If you cannot tolerate them on an empty stomach, you may need to take them with food.  Do not drink milk or take antacids at the same time as your iron supplements. Milk and antacids may interfere with the absorption of iron.  Iron supplements can cause constipation. Make sure to include fiber in your diet to prevent constipation. A stool softener may also be recommended.  Take vitamins as directed by your health care provider.  Eat a diet rich in iron. Foods high in iron include liver, lean beef, whole-grain bread, eggs, dried fruit, and dark green leafy vegetables. SEEK IMMEDIATE MEDICAL CARE IF:   You faint. If this happens, do not drive. Call your local emergency services (911 in [place] if no other help is available.  You have chest pain.  You feel nauseous or vomit.  You have severe or increased shortness of breath with activity.  You feel weak.  You have a rapid heartbeat.  You have unexplained sweating.  You become light-headed when getting up from a chair or bed. MAKE SURE YOU:   Understand these instructions.  Will watch your condition.  Will get help right away if you are not doing well or get worse. Document Released: 03/20/2000 Document Revised: 03/28/2013 Document Reviewed: 11/28/2012 [facility] Patient Information 2015 [place], [place].  This information is not intended to replace advice given to you by your health care provider. Make sure you discuss any questions you have with your health care provider. ° °

## 2013-11-02 NOTE — Discharge Summary (Signed)
Name: Brenda [name] MRN: [id number] DOB: 30-Oct-1970 43 y.o. PCP: Mora Bellman, MD  Date of Admission: 11/01/2013  7:24 AM Date of Discharge: 11/02/2013 Attending Physician: Karren Cobble, MD  Discharge Diagnosis: Principal Problem:   Symptomatic anemia Active Problems:   Abnormal uterine bleeding  Discharge Medications:   Medication List         ferrous fumarate 325 (106 FE) MG Tabs tablet  Commonly known as:  HEMOCYTE - 106 mg FE  Take 1 tablet by mouth 3 (three) times daily.        Disposition and follow-up:   Brenda [name] was discharged from [facility] in Stable condition.  At the hospital follow up visit please address:  1.  Please reassess uterine bleeding, during this admission she denied any active bleeding.   2.  Labs / imaging needed at time of follow-up: CBC, pt was given 1 unit of RBCs w/ hgb of 7.5 on day of discharge.   3.  Pending labs/ test needing follow-up: none  Follow-up Appointments: Dr. Elly Modena- August 17th at 12:45pm  Discharge Instructions: Pt was instructed to continue take her home dose of iron pills TID.    Consultations:  none  Procedures Performed:  Dg Chest 2 View  11/02/2013   CLINICAL DATA:  Shortness of breath and pulmonary edema.  EXAM: CHEST  2 VIEW  COMPARISON:  11/01/2013  FINDINGS: Lungs are adequately inflated and otherwise clear. Cardiomediastinal silhouette and remainder of the exam is unchanged.  IMPRESSION: No active cardiopulmonary disease.   Electronically Signed   By: Marin Olp M.D.   On: 11/02/2013 08:24   Dg Chest 2 View  11/01/2013   CLINICAL DATA:  Shortness of breath, nausea, palpitations  EXAM: CHEST  2 VIEW  COMPARISON:  07/09/2008  FINDINGS: Borderline cardiomegaly with vascular congestion centrally. No focal pneumonia, collapse or consolidation. Negative for edema, effusion or pneumothorax. Trachea is midline. no acute osseous finding.  IMPRESSION: Borderline cardiomegaly with  vascular congestion.   Electronically Signed   By: Daryll Brod M.D.   On: 11/01/2013 08:22    Admission HPI: Pt is a 43 yo with PMH of menorrhagia with intermenstrual bleeding presents with progressively worsening of fatigue with DOE, and acute onset heart palpitations.  She states that over the past 2-3 months she has been experiencing menstrual bleeding almost daily. She states that she will bleed 3 out of 4 weeks. The bleeding is heavy at times but sometimes will be lighter. She states that she has not had vaginal bleeding for the past week, since last Tuesday, 7/21. With this irregular vaginal bleeding, she endorses increased fatigue and dyspnea on exertion that have progressively worsened. She states that she used to get winded walking from the parking lot into work, but now she is winded just getting dressed. She states that this morning she began to experience heart palpitations and decided to come to the ED.  She is followed by her gynecologist, Dr. Elly Modena at [facility] and has been on Provera without much improvement in her bleeding. Per the patient, she had similar bleeding in '04 attributed to endometrial polyps that resolved after a D&C, but then in May '15 her symptoms returned. The pt states that she saw her GYN in June and a transvaginal u/s was performed which revealed uterine fibroids. An endometrial biopsy was performed during that visit which was negative for cancer but was positive for the presence of an endometrial polyp, and was told to return in  2 weeks to discuss IUD insertion, per medical record. The patient states that the earliest appt she could get with her GYN was in August, so she has not been back to the GYN since the end of June.    Hospital Course by problem list: Principal Problem:   Symptomatic anemia Active Problems:   Abnormal uterine bleeding   Symptomatic anemia in the setting of abnormal uterine bleeding: Pt with heavy menses with intramenstrual  bleeding. Fibroid seen on transvaginal u/s in June 2015 and confirmed on endometrial biopsy. In the ED, hgb 6.3, i-stat troponin negative, and EKG without changes to suggest ischemia. FOBT negative, so anemia is likely 2/2 vaginal bleeding. Pt transfused 1 unit RBCs with hgb of 7.5 today and improvement of SOB and symptoms. Iron panel revealed ferritin 3, iron 51, and increased TIBC of 533. Pt given IV feraheme 1020mg  after risks/benefits of IV transfusion discussed with patient. Pt was not actively bleeding during hospital course and megace was held after she received one dose on admission. Pt has f/u w/ GYN to discuss IUD placement vs hysterectomy in August.   Vascular congestion on CXR: Central pulmonary edema seen on portable CXR on admission. Pt does endorse intermittent peripheral edema. No h/o CHF, ProBNP 61.4 on admission. No crackles on exam. Repeat 2 view CXR negative for acute abnormalities.   Discharge Vitals:   BP 131/79  Pulse 96  Temp(Src) 98.4 F (36.9 C) (Oral)  Resp 16  Ht 5\' 7"  (1.702 m)  Wt 250 lb (113.399 kg)  BMI 39.15 kg/m2  SpO2 98%  LMP 10/11/2013  Discharge Labs:  Results for orders placed during the hospital encounter of 11/01/13 (from the past 24 hour(s))  CBC WITH DIFFERENTIAL     Status: Abnormal   Collection Time    11/01/13  2:35 PM      Result Value Ref Range   WBC 9.2  4.0 - 10.5 K/uL   RBC 3.39 (*) 3.87 - 5.11 MIL/uL   Hemoglobin 7.3 (*) 12.0 - 15.0 g/dL   HCT 25.0 (*) 36.0 - 46.0 %   MCV 73.7 (*) 78.0 - 100.0 fL   MCH 21.5 (*) 26.0 - 34.0 pg   MCHC 29.2 (*) 30.0 - 36.0 g/dL   RDW 17.1 (*) 11.5 - 15.5 %   Platelets 197  150 - 400 K/uL   Neutrophils Relative % 66  43 - 77 %   Lymphocytes Relative 27  12 - 46 %   Monocytes Relative 6  3 - 12 %   Eosinophils Relative 1  0 - 5 %   Basophils Relative 0  0 - 1 %   Neutro Abs 6.0  1.7 - 7.7 K/uL   Lymphs Abs 2.5  0.7 - 4.0 K/uL   Monocytes Absolute 0.6  0.1 - 1.0 K/uL   Eosinophils Absolute 0.1  0.0  - 0.7 K/uL   Basophils Absolute 0.0  0.0 - 0.1 K/uL   RBC Morphology POLYCHROMASIA PRESENT     WBC Morphology ATYPICAL LYMPHOCYTES     Smear Review LARGE PLATELETS PRESENT    TROPONIN I     Status: None   Collection Time    11/01/13  2:35 PM      Result Value Ref Range   Troponin I <0.30  <0.30 ng/mL  IRON AND TIBC     Status: Abnormal   Collection Time    11/01/13  2:35 PM      Result Value Ref Range   Iron 51  42 - 135 ug/dL   TIBC 533 (*) 250 - 470 ug/dL   Saturation Ratios 10 (*) 20 - 55 %   UIBC 482 (*) 125 - 400 ug/dL  FERRITIN     Status: Abnormal   Collection Time    11/01/13  2:35 PM      Result Value Ref Range   Ferritin 3 (*) 10 - 291 ng/mL  CBC     Status: Abnormal   Collection Time    11/02/13  6:20 AM      Result Value Ref Range   WBC 8.9  4.0 - 10.5 K/uL   RBC 3.42 (*) 3.87 - 5.11 MIL/uL   Hemoglobin 7.5 (*) 12.0 - 15.0 g/dL   HCT 25.4 (*) 36.0 - 46.0 %   MCV 74.3 (*) 78.0 - 100.0 fL   MCH 21.9 (*) 26.0 - 34.0 pg   MCHC 29.5 (*) 30.0 - 36.0 g/dL   RDW 17.2 (*) 11.5 - 15.5 %   Platelets 225  150 - 400 K/uL  BASIC METABOLIC PANEL     Status: Abnormal   Collection Time    11/02/13  6:20 AM      Result Value Ref Range   Sodium 139  137 - 147 mEq/L   Potassium 4.3  3.7 - 5.3 mEq/L   Chloride 101  96 - 112 mEq/L   CO2 24  19 - 32 mEq/L   Glucose, Bld 106 (*) 70 - 99 mg/dL   BUN 11  6 - 23 mg/dL   Creatinine, Ser 0.85  0.50 - 1.10 mg/dL   Calcium 8.9  8.4 - 10.5 mg/dL   GFR calc non Af Amer 83 (*) >90 mL/min   GFR calc Af Amer >90  >90 mL/min   Anion gap 14  5 - 15    Signed: Julious Oka, MD 11/02/2013, 1:19 PM    Services Ordered on Discharge: none Equipment Ordered on Discharge: none

## 2013-11-03 LAB — TYPE AND SCREEN
ABO/RH(D): A POS
ANTIBODY SCREEN: NEGATIVE
UNIT DIVISION: 0
Unit division: 0

## 2013-11-10 ENCOUNTER — Encounter: Payer: Self-pay | Admitting: Obstetrics and Gynecology

## 2013-11-10 ENCOUNTER — Ambulatory Visit (INDEPENDENT_AMBULATORY_CARE_PROVIDER_SITE_OTHER): Payer: No Typology Code available for payment source | Admitting: Obstetrics and Gynecology

## 2013-11-10 VITALS — BP 149/95 | HR 90 | Ht 67.0 in | Wt 253.0 lb

## 2013-11-10 DIAGNOSIS — Z712 Person consulting for explanation of examination or test findings: Secondary | ICD-10-CM

## 2013-11-10 DIAGNOSIS — Z1151 Encounter for screening for human papillomavirus (HPV): Secondary | ICD-10-CM

## 2013-11-10 DIAGNOSIS — Z124 Encounter for screening for malignant neoplasm of cervix: Secondary | ICD-10-CM

## 2013-11-10 DIAGNOSIS — N949 Unspecified condition associated with female genital organs and menstrual cycle: Secondary | ICD-10-CM

## 2013-11-10 DIAGNOSIS — N938 Other specified abnormal uterine and vaginal bleeding: Secondary | ICD-10-CM

## 2013-11-10 NOTE — Progress Notes (Signed)
Patient ID: Brenda [name], female   DOB: [birth year], 43 y.o.   MRN: [id number] 43 yo here to discuss results of endometrial biopsy and further management of her dysfunctional uterine bleeding  Endometrium, biopsy BENIGN ENDOMETRIAL POLYP AND BENIGN PROLIFERATIVE ENDOMETRIUM, NO ATYPIA, HYPERPLASIA OR MALIGNANCY.  Results were reviewed and explained. Patient reports persistent vaginal bleeding. She was recently hospitalized and admitted for blood transfusion. Patient is intereted in definitive management with hysterectomy  A/P 43 yo with DUB - Risks, benefits and alternatives were explained including but not limited to risks of bleeding, infection and damage to adjacent organs. Patient verbalized understanding and all questions were answered. - Patient will be contacted and scheduled for a TAH/bilateral salpingectomy - pap smear collected today

## 2013-11-13 LAB — CYTOLOGY - PAP

## 2013-11-20 ENCOUNTER — Ambulatory Visit: Payer: Self-pay | Admitting: Obstetrics and Gynecology

## 2013-11-28 ENCOUNTER — Encounter: Payer: Self-pay | Admitting: Obstetrics & Gynecology

## 2013-11-28 ENCOUNTER — Encounter: Payer: Self-pay | Admitting: *Deleted

## 2013-11-28 ENCOUNTER — Encounter: Payer: Self-pay | Admitting: General Practice

## 2013-12-01 ENCOUNTER — Encounter: Payer: Self-pay | Admitting: General Practice

## 2013-12-04 ENCOUNTER — Ambulatory Visit (INDEPENDENT_AMBULATORY_CARE_PROVIDER_SITE_OTHER): Payer: No Typology Code available for payment source | Admitting: Obstetrics & Gynecology

## 2013-12-04 ENCOUNTER — Encounter: Payer: Self-pay | Admitting: Obstetrics & Gynecology

## 2013-12-04 VITALS — BP 153/95 | HR 87 | Temp 98.8°F | Ht 67.0 in | Wt 248.9 lb

## 2013-12-04 DIAGNOSIS — N920 Excessive and frequent menstruation with regular cycle: Secondary | ICD-10-CM

## 2013-12-04 DIAGNOSIS — N921 Excessive and frequent menstruation with irregular cycle: Secondary | ICD-10-CM

## 2013-12-04 NOTE — Patient Instructions (Signed)
Total Laparoscopic Hysterectomy, Care After Refer to this sheet in the next few weeks. These instructions provide you with information on caring for yourself after your procedure. Your health care provider may also give you more specific instructions. Your treatment has been planned according to current medical practices, but problems sometimes occur. Call your health care provider if you have any problems or questions after your procedure. WHAT TO EXPECT AFTER THE PROCEDURE  Pain and bruising at the incision sites. You will be given pain medicine to control it.  Menopausal symptoms such as hot flashes, night sweats, and insomnia if your ovaries were removed.  Sore throat from the breathing tube that was inserted during surgery. HOME CARE INSTRUCTIONS  Only take over-the-counter or prescription medicines for pain, discomfort, or fever as directed by your health care provider.   Do not take aspirin. It can cause bleeding.   Do not drive when taking pain medicine.   Follow your health care provider's advice regarding diet, exercise, lifting, driving, and general activities.   Resume your usual diet as directed and allowed.   Get plenty of rest and sleep.   Do not douche, use tampons, or have sexual intercourse for at least 6 weeks, or until your health care provider gives you permission.   Change your bandages (dressings) as directed by your health care provider.   Monitor your temperature and notify your health care provider of a fever.   Take showers instead of baths for 2-3 weeks.   Do not drink alcohol until your health care provider gives you permission.   If you develop constipation, you may take a mild laxative with your health care provider's permission. Bran foods may help with constipation problems. Drinking enough fluids to keep your urine clear or pale yellow may help as well.   Try to have someone home with you for 1-2 weeks to help around the house.    Keep all of your follow-up appointments as directed by your health care provider.  SEEK MEDICAL CARE IF:  You have swelling, redness, or increasing pain around your incision sites.   You have pus coming from your incision.   You notice a bad smell coming from your incision.   Your incision breaks open.   You feel dizzy or lightheaded.   You have pain or bleeding when you urinate.   You have persistent diarrhea.   You have persistent nausea and vomiting.   You have abnormal vaginal discharge.   You have a rash.   You have any type of abnormal reaction or develop an allergy to your medicine.   You have poor pain control with your prescribed medicine.  SEEK IMMEDIATE MEDICAL CARE IF:  You have chest pain or shortness of breath.  You have severe abdominal pain that is not relieved with pain medicine.  You have pain or swelling in your legs. MAKE SURE YOU:  Understand these instructions.  Will watch your condition.  Will get help right away if you are not doing well or get worse. Document Released: 01/11/2013 Document Revised: 03/28/2013 Document Reviewed: 01/11/2013 [facility] Patient Information 2015 [place], [place]. This information is not intended to replace advice given to you by your health care provider. Make sure you discuss any questions you have with your health care provider. Laparoscopically Assisted Vaginal Hysterectomy  A laparoscopically assisted vaginal hysterectomy (LAVH) is a surgical procedure to remove the uterus and cervix, and sometimes the ovaries and fallopian tubes. During an LAVH, some of the surgical removal  is done through the vagina, and the rest is done through a few small surgical cuts (incisions) in the abdomen.  This procedure is usually considered in women when a vaginal hysterectomy is not an option. Your health care provider will discuss the risks and benefits of the different surgical techniques at your appointment.  Generally, recovery time is faster and there are fewer complications after laparoscopic procedures than after open incisional procedures. LET [facility] CARE PROVIDER KNOW ABOUT:   Any allergies you have.  All medicines you are taking, including vitamins, herbs, eye drops, creams, and over-the-counter medicines.  Previous problems you or members of your family have had with the use of anesthetics.  Any blood disorders you have.  Previous surgeries you have had.  Medical conditions you have. RISKS AND COMPLICATIONS Generally, this is a safe procedure. However, as with any procedure, complications can occur. Possible complications include:  Allergies to medicines.  Difficulty breathing.  Bleeding.  Infection.  Damage to other structures near your uterus and cervix. BEFORE THE PROCEDURE  Ask your health care provider about changing or stopping your regular medicines.  Take certain medicines, such as a colon-emptying preparation, as directed.  Do not eat or drink anything for at least 8 hours before your surgery.  Stop smoking if you smoke. Stopping will improve your health after surgery.  Arrange for a ride home after surgery and for help at home during recovery. PROCEDURE   An IV tube will be put into one of your veins in order to give you fluids and medicines.  You will receive medicines to relax you and medicines that make you sleep (general anesthetic).  You may have a flexible tube (catheter) put into your bladder to drain urine.  You may have a tube put through your nose or mouth that goes into your stomach (nasogastric tube). The nasogastric tube removes digestive fluids and prevents you from feeling nauseated and from vomiting.  Tight-fitting (compression) stockings will be placed on your legs to promote circulation.  Three to four small incisions will be made in your abdomen. An incision also will be made in your vagina. Probes and tools will be inserted into  the small incisions. The uterus and cervix are removed (and possibly your ovaries and fallopian tubes) through your vagina as well as through the small incisions that were made in the abdomen.  Your vagina is then sewn back to normal. AFTER THE PROCEDURE  You may have a liquid diet temporarily. You will most likely return to, and tolerate, your usual diet the day after surgery.  You will be passing urine through a catheter. It will be removed the day after surgery.  Your temperature, breathing rate, heart rate, blood pressure, and oxygen level will be monitored regularly.  You will still wear compression stockings on your legs until you are able to move around.  You will use a special device or do breathing exercises to keep your lungs clear.  You will be encouraged to walk as soon as possible. Document Released: 03/12/2011 Document Revised: 11/23/2012 Document Reviewed: 10/06/2012 [facility] Patient Information 2015 [place], [place]. This information is not intended to replace advice given to you by your health care provider. Make sure you discuss any questions you have with your health care provider.

## 2013-12-04 NOTE — Progress Notes (Signed)
Subjective:     Patient ID: Brenda [name], female   DOB: 1970/12/18, 43 y.o.   MRN: [id number]  HPI Pt prev scheduled for hyst by Dr. Elly Modena. Pt requests a Robot assisted hyst.  Her for pelvic exam.  Full chart reviewed including PAP, sono and  endobx    Review of Systems     Objective:   Physical ExamPt in NAD  GU: EGBUS: no lesions Vagina: no blood in vault Cervix: no lesion; no mucopurulent d/c Uterus:  Mobile; 8-10 weeks sized Adnexa: no masses; non tender         Assessment:     menorrhagia     Plan:     Patient desires surgical management with robot assisted total laparoscopic hysterectomy with bilateral salpingectomy.  The risks of surgery were discussed in detail with the patient including but not limited to: bleeding which may require transfusion or reoperation; infection which may require prolonged hospitalization or re-hospitalization and antibiotic therapy; injury to bowel, bladder, ureters and major vessels or other surrounding organs; need for additional procedures including laparotomy; thromboembolic phenomenon, incisional problems and other postoperative or anesthesia complications.  The pt was warned specifically about injury to the bladder due to her previous c-sections x 2.  Patient was told that the likelihood that her condition and symptoms will be treated effectively with this surgical management was very high; the postoperative expectations were also discussed in detail. The patient also understands the alternative treatment options which were discussed in full. All questions were answered.told to be NPO for 8 hours preop and to refrain from taking all NSAIDS.

## 2013-12-06 ENCOUNTER — Encounter (HOSPITAL_COMMUNITY): Payer: Self-pay

## 2013-12-06 ENCOUNTER — Encounter (HOSPITAL_COMMUNITY)
Admission: RE | Admit: 2013-12-06 | Discharge: 2013-12-06 | Disposition: A | Discharge status: Home / Self Care | Payer: No Typology Code available for payment source | Source: Ambulatory Visit | Attending: Obstetrics & Gynecology | Admitting: Obstetrics & Gynecology

## 2013-12-06 DIAGNOSIS — N949 Unspecified condition associated with female genital organs and menstrual cycle: Secondary | ICD-10-CM | POA: Diagnosis present

## 2013-12-06 DIAGNOSIS — Z309 Encounter for contraceptive management, unspecified: Secondary | ICD-10-CM | POA: Insufficient documentation

## 2013-12-06 DIAGNOSIS — Z01818 Encounter for other preprocedural examination: Secondary | ICD-10-CM | POA: Diagnosis present

## 2013-12-06 DIAGNOSIS — N938 Other specified abnormal uterine and vaginal bleeding: Secondary | ICD-10-CM | POA: Insufficient documentation

## 2013-12-06 LAB — TYPE AND SCREEN
ABO/RH(D): A POS
Antibody Screen: NEGATIVE

## 2013-12-06 LAB — CBC
HCT: 30.7 % — ABNORMAL LOW (ref 36.0–46.0)
Hemoglobin: 9.7 g/dL — ABNORMAL LOW (ref 12.0–15.0)
MCH: 25.5 pg — AB (ref 26.0–34.0)
MCHC: 31.6 g/dL (ref 30.0–36.0)
MCV: 80.6 fL (ref 78.0–100.0)
Platelets: 162 10*3/uL (ref 150–400)
RBC: 3.81 MIL/uL — AB (ref 3.87–5.11)
RDW: 24 % — ABNORMAL HIGH (ref 11.5–15.5)
WBC: 5.9 10*3/uL (ref 4.0–10.5)

## 2013-12-06 LAB — ABO/RH: ABO/RH(D): A POS

## 2013-12-06 NOTE — Patient Instructions (Signed)
Your procedure is scheduled on: Tuesday, December 12, 2013  Enter through the [employer] [facility] at: 6:00am  Pick up the phone at the desk and dial [phone].  Call this number if you have problems the morning of surgery: [phone].  Remember: Do NOT eat food: AFTER MIDNIGHT MONDAY Do NOT drink clear liquids after: AFTER MIDNIGHT MONDAY Take these medicines the morning of surgery with a SIP OF WATER: NONE  Do NOT wear jewelry (body piercing), metal hair clips/bobby pins, make-up, or nail polish. Do NOT wear lotions, powders, or perfumes.  You may wear deoderant. Do NOT shave for 48 hours prior to surgery. Do NOT bring valuables to the hospital. Contacts, dentures, or bridgework may not be worn into surgery. Leave suitcase in car.  After surgery it may be brought to your room.  For patients admitted to the hospital, checkout time is 11:00 AM the day of discharge.

## 2013-12-08 ENCOUNTER — Encounter (HOSPITAL_COMMUNITY): Payer: Self-pay | Admitting: Pharmacist

## 2013-12-12 ENCOUNTER — Encounter (HOSPITAL_COMMUNITY)
Admission: RE | Disposition: A | Discharge status: Home / Self Care | Payer: Self-pay | Source: Ambulatory Visit | Attending: Obstetrics & Gynecology

## 2013-12-12 ENCOUNTER — Encounter (HOSPITAL_COMMUNITY): Payer: No Typology Code available for payment source | Admitting: Anesthesiology

## 2013-12-12 ENCOUNTER — Ambulatory Visit (HOSPITAL_COMMUNITY)
Admission: RE | Admit: 2013-12-12 | Discharge: 2013-12-12 | Disposition: A | Discharge status: Home / Self Care | Payer: No Typology Code available for payment source | Source: Ambulatory Visit | Attending: Obstetrics & Gynecology | Admitting: Obstetrics & Gynecology

## 2013-12-12 ENCOUNTER — Encounter: Payer: Self-pay | Admitting: Obstetrics & Gynecology

## 2013-12-12 ENCOUNTER — Encounter (HOSPITAL_COMMUNITY): Payer: Self-pay

## 2013-12-12 ENCOUNTER — Ambulatory Visit (HOSPITAL_COMMUNITY): Payer: No Typology Code available for payment source | Admitting: Anesthesiology

## 2013-12-12 DIAGNOSIS — D500 Iron deficiency anemia secondary to blood loss (chronic): Secondary | ICD-10-CM | POA: Insufficient documentation

## 2013-12-12 DIAGNOSIS — N939 Abnormal uterine and vaginal bleeding, unspecified: Secondary | ICD-10-CM

## 2013-12-12 DIAGNOSIS — N925 Other specified irregular menstruation: Secondary | ICD-10-CM | POA: Insufficient documentation

## 2013-12-12 DIAGNOSIS — R011 Cardiac murmur, unspecified: Secondary | ICD-10-CM | POA: Insufficient documentation

## 2013-12-12 DIAGNOSIS — D649 Anemia, unspecified: Secondary | ICD-10-CM

## 2013-12-12 DIAGNOSIS — N938 Other specified abnormal uterine and vaginal bleeding: Secondary | ICD-10-CM | POA: Insufficient documentation

## 2013-12-12 DIAGNOSIS — R51 Headache: Secondary | ICD-10-CM | POA: Diagnosis not present

## 2013-12-12 DIAGNOSIS — D251 Intramural leiomyoma of uterus: Secondary | ICD-10-CM | POA: Diagnosis not present

## 2013-12-12 DIAGNOSIS — N926 Irregular menstruation, unspecified: Secondary | ICD-10-CM

## 2013-12-12 DIAGNOSIS — N949 Unspecified condition associated with female genital organs and menstrual cycle: Secondary | ICD-10-CM | POA: Insufficient documentation

## 2013-12-12 DIAGNOSIS — Z9889 Other specified postprocedural states: Secondary | ICD-10-CM

## 2013-12-12 HISTORY — PX: CYSTOSCOPY: SHX5120

## 2013-12-12 HISTORY — PX: ROBOTIC ASSISTED TOTAL HYSTERECTOMY: SHX6085

## 2013-12-12 LAB — PREGNANCY, URINE: PREG TEST UR: NEGATIVE

## 2013-12-12 LAB — PREPARE RBC (CROSSMATCH)

## 2013-12-12 SURGERY — ROBOTIC ASSISTED TOTAL HYSTERECTOMY
Anesthesia: General | Site: Urethra

## 2013-12-12 MED ORDER — DEXAMETHASONE SODIUM PHOSPHATE 10 MG/ML IJ SOLN
INTRAMUSCULAR | Status: DC | PRN
  Administered 2013-12-12: 10 mg via INTRAVENOUS

## 2013-12-12 MED ORDER — FENTANYL CITRATE 0.05 MG/ML IJ SOLN
INTRAMUSCULAR | Status: DC | PRN
  Administered 2013-12-12: 50 ug via INTRAVENOUS
  Administered 2013-12-12: 100 ug via INTRAVENOUS
  Administered 2013-12-12 (×3): 50 ug via INTRAVENOUS

## 2013-12-12 MED ORDER — MEPERIDINE HCL 25 MG/ML IJ SOLN: 6.2500 mg | INTRAMUSCULAR | Status: DC | PRN

## 2013-12-12 MED ORDER — METHYLENE BLUE 1 % INJ SOLN
INTRAMUSCULAR | Status: AC
  Filled 2013-12-12: qty 10

## 2013-12-12 MED ORDER — FENTANYL CITRATE 0.05 MG/ML IJ SOLN
INTRAMUSCULAR | Status: AC
  Filled 2013-12-12: qty 2

## 2013-12-12 MED ORDER — NEOSTIGMINE METHYLSULFATE 10 MG/10ML IV SOLN
INTRAVENOUS | Status: DC | PRN
  Administered 2013-12-12: 3 mg via INTRAVENOUS
  Administered 2013-12-12: 1 mg via INTRAVENOUS

## 2013-12-12 MED ORDER — CEFAZOLIN SODIUM-DEXTROSE 2-3 GM-% IV SOLR
2.0000 g | INTRAVENOUS | Status: AC
  Administered 2013-12-12: 2 g via INTRAVENOUS

## 2013-12-12 MED ORDER — HYDROMORPHONE HCL PF 1 MG/ML IJ SOLN
INTRAMUSCULAR | Status: DC | PRN
  Administered 2013-12-12: 1 mg via INTRAVENOUS

## 2013-12-12 MED ORDER — STERILE WATER FOR IRRIGATION IR SOLN
Status: DC | PRN
  Administered 2013-12-12: 1000 mL

## 2013-12-12 MED ORDER — GLYCOPYRROLATE 0.2 MG/ML IJ SOLN
INTRAMUSCULAR | Status: DC | PRN
  Administered 2013-12-12: 0.2 mg via INTRAVENOUS
  Administered 2013-12-12: 0.6 mg via INTRAVENOUS

## 2013-12-12 MED ORDER — OXYCODONE-ACETAMINOPHEN 5-325 MG PO TABS
1.0000 | ORAL_TABLET | ORAL | Status: DC | PRN
  Administered 2013-12-12 (×2): 2 via ORAL
  Filled 2013-12-12 (×2): qty 2

## 2013-12-12 MED ORDER — CEFAZOLIN SODIUM-DEXTROSE 2-3 GM-% IV SOLR
INTRAVENOUS | Status: AC
  Filled 2013-12-12: qty 50

## 2013-12-12 MED ORDER — PHENYLEPHRINE HCL 10 MG/ML IJ SOLN
10.0000 mg | INTRAVENOUS | Status: DC | PRN
  Administered 2013-12-12: 20 ug/min via INTRAVENOUS

## 2013-12-12 MED ORDER — PANTOPRAZOLE SODIUM 40 MG PO TBEC: 40.0000 mg | DELAYED_RELEASE_TABLET | Freq: Every day | ORAL | Status: DC

## 2013-12-12 MED ORDER — LABETALOL HCL 5 MG/ML IV SOLN
INTRAVENOUS | Status: DC | PRN
  Administered 2013-12-12 (×4): 5 mg via INTRAVENOUS

## 2013-12-12 MED ORDER — ONDANSETRON HCL 4 MG PO TABS: 4.0000 mg | ORAL_TABLET | Freq: Four times a day (QID) | ORAL | Status: DC | PRN

## 2013-12-12 MED ORDER — SCOPOLAMINE 1 MG/3DAYS TD PT72
MEDICATED_PATCH | TRANSDERMAL | Status: AC
  Administered 2013-12-12: 1.5 mg via TRANSDERMAL
  Filled 2013-12-12: qty 1

## 2013-12-12 MED ORDER — LACTATED RINGERS IV SOLN: INTRAVENOUS | Status: DC

## 2013-12-12 MED ORDER — KETOROLAC TROMETHAMINE 30 MG/ML IJ SOLN
30.0000 mg | Freq: Four times a day (QID) | INTRAMUSCULAR | Status: DC
  Filled 2013-12-12 (×2): qty 1

## 2013-12-12 MED ORDER — PROPOFOL 10 MG/ML IV BOLUS
INTRAVENOUS | Status: DC | PRN
  Administered 2013-12-12: 300 mg via INTRAVENOUS

## 2013-12-12 MED ORDER — KETOROLAC TROMETHAMINE 30 MG/ML IJ SOLN
INTRAMUSCULAR | Status: AC
  Filled 2013-12-12: qty 1

## 2013-12-12 MED ORDER — MIDAZOLAM HCL 2 MG/2ML IJ SOLN
INTRAMUSCULAR | Status: AC
  Filled 2013-12-12: qty 2

## 2013-12-12 MED ORDER — MORPHINE SULFATE 4 MG/ML IJ SOLN: 1.0000 mg | INTRAMUSCULAR | Status: DC | PRN

## 2013-12-12 MED ORDER — ROCURONIUM BROMIDE 100 MG/10ML IV SOLN
INTRAVENOUS | Status: AC
  Filled 2013-12-12: qty 1

## 2013-12-12 MED ORDER — ONDANSETRON HCL 4 MG/2ML IJ SOLN
INTRAMUSCULAR | Status: AC
  Filled 2013-12-12: qty 2

## 2013-12-12 MED ORDER — KETOROLAC TROMETHAMINE 30 MG/ML IJ SOLN
INTRAMUSCULAR | Status: DC | PRN
  Administered 2013-12-12: 30 mg via INTRAVENOUS

## 2013-12-12 MED ORDER — LIDOCAINE HCL (CARDIAC) 20 MG/ML IV SOLN
INTRAVENOUS | Status: DC | PRN
  Administered 2013-12-12: 80 mg via INTRAVENOUS

## 2013-12-12 MED ORDER — PROPOFOL 10 MG/ML IV EMUL
INTRAVENOUS | Status: AC
  Filled 2013-12-12: qty 20

## 2013-12-12 MED ORDER — METHYLENE BLUE 1 % INJ SOLN
INTRAMUSCULAR | Status: DC | PRN
  Administered 2013-12-12: 7 mL via INTRAVENOUS

## 2013-12-12 MED ORDER — MIDAZOLAM HCL 2 MG/2ML IJ SOLN: 0.5000 mg | Freq: Once | INTRAMUSCULAR | Status: DC | PRN

## 2013-12-12 MED ORDER — BUPIVACAINE HCL (PF) 0.5 % IJ SOLN
INTRAMUSCULAR | Status: AC
  Filled 2013-12-12: qty 30

## 2013-12-12 MED ORDER — LACTATED RINGERS IR SOLN
Status: DC | PRN
  Administered 2013-12-12: 3000 mL

## 2013-12-12 MED ORDER — DOCUSATE SODIUM 100 MG PO CAPS
100.0000 mg | ORAL_CAPSULE | Freq: Two times a day (BID) | ORAL | Status: DC
  Administered 2013-12-12: 100 mg via ORAL
  Filled 2013-12-12: qty 1

## 2013-12-12 MED ORDER — GLYCOPYRROLATE 0.2 MG/ML IJ SOLN
INTRAMUSCULAR | Status: AC
  Filled 2013-12-12: qty 3

## 2013-12-12 MED ORDER — LABETALOL HCL 5 MG/ML IV SOLN
INTRAVENOUS | Status: AC
  Filled 2013-12-12: qty 4

## 2013-12-12 MED ORDER — DEXTROSE-NACL 5-0.45 % IV SOLN
INTRAVENOUS | Status: DC
  Administered 2013-12-12: 15:00:00 via INTRAVENOUS

## 2013-12-12 MED ORDER — HYDROMORPHONE HCL PF 1 MG/ML IJ SOLN: 0.2500 mg | INTRAMUSCULAR | Status: DC | PRN

## 2013-12-12 MED ORDER — LIDOCAINE HCL (CARDIAC) 20 MG/ML IV SOLN
INTRAVENOUS | Status: AC
  Filled 2013-12-12: qty 5

## 2013-12-12 MED ORDER — SCOPOLAMINE 1 MG/3DAYS TD PT72
1.0000 | MEDICATED_PATCH | Freq: Once | TRANSDERMAL | Status: DC
  Administered 2013-12-12: 1.5 mg via TRANSDERMAL

## 2013-12-12 MED ORDER — LACTATED RINGERS IV SOLN
INTRAVENOUS | Status: DC
  Administered 2013-12-12 (×2): via INTRAVENOUS

## 2013-12-12 MED ORDER — OXYCODONE-ACETAMINOPHEN 5-325 MG PO TABS: 1.0000 | ORAL_TABLET | Freq: Four times a day (QID) | ORAL | Status: DC | PRN

## 2013-12-12 MED ORDER — ROCURONIUM BROMIDE 100 MG/10ML IV SOLN
INTRAVENOUS | Status: DC | PRN
  Administered 2013-12-12: 50 mg via INTRAVENOUS
  Administered 2013-12-12: 10 mg via INTRAVENOUS

## 2013-12-12 MED ORDER — DIPHENHYDRAMINE HCL 50 MG/ML IJ SOLN
INTRAMUSCULAR | Status: DC | PRN
  Administered 2013-12-12: 12.5 mg via INTRAVENOUS

## 2013-12-12 MED ORDER — DIPHENHYDRAMINE HCL 50 MG/ML IJ SOLN
INTRAMUSCULAR | Status: AC
  Filled 2013-12-12: qty 4

## 2013-12-12 MED ORDER — DEXAMETHASONE SODIUM PHOSPHATE 10 MG/ML IJ SOLN
INTRAMUSCULAR | Status: AC
  Filled 2013-12-12: qty 1

## 2013-12-12 MED ORDER — FENTANYL CITRATE 0.05 MG/ML IJ SOLN
INTRAMUSCULAR | Status: AC
Start: 2013-12-12 — End: 2013-12-12
  Filled 2013-12-12: qty 5

## 2013-12-12 MED ORDER — KETOROLAC TROMETHAMINE 30 MG/ML IJ SOLN
30.0000 mg | Freq: Four times a day (QID) | INTRAMUSCULAR | Status: DC
Start: 2013-12-12 — End: 2013-12-13

## 2013-12-12 MED ORDER — KETOROLAC TROMETHAMINE 30 MG/ML IJ SOLN: 15.0000 mg | Freq: Once | INTRAMUSCULAR | Status: DC | PRN

## 2013-12-12 MED ORDER — PROMETHAZINE HCL 25 MG/ML IJ SOLN: 6.2500 mg | INTRAMUSCULAR | Status: DC | PRN

## 2013-12-12 MED ORDER — NEOSTIGMINE METHYLSULFATE 10 MG/10ML IV SOLN
INTRAVENOUS | Status: AC
  Filled 2013-12-12: qty 1

## 2013-12-12 MED ORDER — IBUPROFEN 600 MG PO TABS: 600.0000 mg | ORAL_TABLET | Freq: Four times a day (QID) | ORAL | Status: DC | PRN

## 2013-12-12 MED ORDER — PHENYLEPHRINE HCL 10 MG/ML IJ SOLN
INTRAMUSCULAR | Status: AC
  Filled 2013-12-12: qty 3

## 2013-12-12 MED ORDER — BUPIVACAINE HCL (PF) 0.5 % IJ SOLN
INTRAMUSCULAR | Status: DC | PRN
  Administered 2013-12-12: 30 mL

## 2013-12-12 MED ORDER — ONDANSETRON HCL 4 MG/2ML IJ SOLN
INTRAMUSCULAR | Status: DC | PRN
  Administered 2013-12-12: 4 mg via INTRAVENOUS

## 2013-12-12 MED ORDER — MIDAZOLAM HCL 2 MG/2ML IJ SOLN
INTRAMUSCULAR | Status: DC | PRN
  Administered 2013-12-12: 2 mg via INTRAVENOUS

## 2013-12-12 MED ORDER — HYDROMORPHONE HCL PF 1 MG/ML IJ SOLN
INTRAMUSCULAR | Status: AC
  Filled 2013-12-12: qty 1

## 2013-12-12 MED ORDER — ONDANSETRON HCL 4 MG/2ML IJ SOLN: 4.0000 mg | Freq: Four times a day (QID) | INTRAMUSCULAR | Status: DC | PRN

## 2013-12-12 MED ORDER — SIMETHICONE 80 MG PO CHEW: 80.0000 mg | CHEWABLE_TABLET | Freq: Four times a day (QID) | ORAL | Status: DC | PRN

## 2013-12-12 SURGICAL SUPPLY — 67 items
ADH SKN CLS APL DERMABOND .7 (GAUZE/BANDAGES/DRESSINGS) ×2
APL SKNCLS STERI-STRIP NONHPOA (GAUZE/BANDAGES/DRESSINGS) ×2
APPLIER CLIP 5 13 M/L LIGAMAX5 (MISCELLANEOUS)
APR CLP MED LRG 5 ANG JAW (MISCELLANEOUS)
BARRIER ADHS 3X4 INTERCEED (GAUZE/BANDAGES/DRESSINGS) IMPLANT
BENZOIN TINCTURE PRP APPL 2/3 (GAUZE/BANDAGES/DRESSINGS) ×4 IMPLANT
BRR ADH 4X3 ABS CNTRL BYND (GAUZE/BANDAGES/DRESSINGS)
CABLE HIGH FREQUENCY MONO STRZ (ELECTRODE) ×2 IMPLANT
CATH FOLEY 3WAY  5CC 16FR (CATHETERS) ×2
CATH FOLEY 3WAY 5CC 16FR (CATHETERS) ×2 IMPLANT
CLIP APPLIE 5 13 M/L LIGAMAX5 (MISCELLANEOUS) IMPLANT
CLOSURE WOUND 1/2 X4 (GAUZE/BANDAGES/DRESSINGS) ×1
CLOTH BEACON ORANGE TIMEOUT ST (SAFETY) ×4 IMPLANT
CONT PATH 16OZ SNAP LID 3702 (MISCELLANEOUS) ×4 IMPLANT
COVER TABLE BACK 60X90 (DRAPES) ×8 IMPLANT
COVER TIP SHEARS 8 DVNC (MISCELLANEOUS) ×2 IMPLANT
COVER TIP SHEARS 8MM DA VINCI (MISCELLANEOUS) ×2
DECANTER SPIKE VIAL GLASS SM (MISCELLANEOUS) ×4 IMPLANT
DERMABOND ADVANCED (GAUZE/BANDAGES/DRESSINGS) ×2
DERMABOND ADVANCED .7 DNX12 (GAUZE/BANDAGES/DRESSINGS) ×2 IMPLANT
DEVICE TROCAR PUNCTURE CLOSURE (ENDOMECHANICALS) IMPLANT
DRAPE HUG U DISPOSABLE (DRAPE) ×4 IMPLANT
DRAPE WARM FLUID 44X44 (DRAPE) ×4 IMPLANT
DRSG COVADERM PLUS 2X2 (GAUZE/BANDAGES/DRESSINGS) ×2 IMPLANT
DRSG OPSITE POSTOP 3X4 (GAUZE/BANDAGES/DRESSINGS) ×2 IMPLANT
DURAPREP 26ML APPLICATOR (WOUND CARE) ×4 IMPLANT
ELECT REM PT RETURN 9FT ADLT (ELECTROSURGICAL) ×4
ELECTRODE REM PT RTRN 9FT ADLT (ELECTROSURGICAL) ×2 IMPLANT
EVACUATOR SMOKE 8.L (FILTER) ×6 IMPLANT
GAUZE VASELINE 3X9 (GAUZE/BANDAGES/DRESSINGS) ×2 IMPLANT
GLOVE BIO SURGEON STRL SZ7 (GLOVE) ×8 IMPLANT
GLOVE BIOGEL PI IND STRL 7.0 (GLOVE) ×8 IMPLANT
GLOVE BIOGEL PI INDICATOR 7.0 (GLOVE) ×8
GOWN STRL REUS W/TWL LRG LVL3 (GOWN DISPOSABLE) ×40 IMPLANT
KIT ACCESSORY DA VINCI DISP (KITS) ×2
KIT ACCESSORY DVNC DISP (KITS) ×2 IMPLANT
LEGGING LITHOTOMY PAIR STRL (DRAPES) ×4 IMPLANT
NEEDLE INSUFFLATION 120MM (ENDOMECHANICALS) ×4 IMPLANT
OCCLUDER COLPOPNEUMO (BALLOONS) ×6 IMPLANT
PACK ROBOT WH (CUSTOM PROCEDURE TRAY) ×4 IMPLANT
PAD PREP 24X48 CUFFED NSTRL (MISCELLANEOUS) ×8 IMPLANT
PROTECTOR NERVE ULNAR (MISCELLANEOUS) ×8 IMPLANT
SET CYSTO W/LG BORE CLAMP LF (SET/KITS/TRAYS/PACK) ×2 IMPLANT
SET IRRIG TUBING LAPAROSCOPIC (IRRIGATION / IRRIGATOR) ×4 IMPLANT
STRIP CLOSURE SKIN 1/2X4 (GAUZE/BANDAGES/DRESSINGS) ×3 IMPLANT
SURGIFLO W/THROMBIN 8M KIT (HEMOSTASIS) IMPLANT
SUT VIC AB 0 CT1 27 (SUTURE) ×8
SUT VIC AB 0 CT1 27XBRD ANBCTR (SUTURE) ×4 IMPLANT
SUT VIC AB 0 CT1 27XBRD ANTBC (SUTURE) IMPLANT
SUT VICRYL 0 UR6 27IN ABS (SUTURE) ×8 IMPLANT
SUT VICRYL 4-0 PS2 18IN ABS (SUTURE) ×8 IMPLANT
SUT VLOC 180 0 9IN  GS21 (SUTURE) ×2
SUT VLOC 180 0 9IN GS21 (SUTURE) IMPLANT
SYRINGE 60CC LL (MISCELLANEOUS) ×4 IMPLANT
SYSTEM CONVERTIBLE TROCAR (TROCAR) ×2 IMPLANT
TIP RUMI ORANGE 6.7MMX12CM (TIP) IMPLANT
TIP UTERINE 5.1X6CM LAV DISP (MISCELLANEOUS) IMPLANT
TIP UTERINE 6.7X10CM GRN DISP (MISCELLANEOUS) ×2 IMPLANT
TIP UTERINE 6.7X6CM WHT DISP (MISCELLANEOUS) IMPLANT
TIP UTERINE 6.7X8CM BLUE DISP (MISCELLANEOUS) IMPLANT
TOWEL OR 17X24 6PK STRL BLUE (TOWEL DISPOSABLE) ×8 IMPLANT
TROCAR DILATING TIP 12MM 150MM (ENDOMECHANICALS) ×4 IMPLANT
TROCAR DISP BLADELESS 8 DVNC (TROCAR) ×4 IMPLANT
TROCAR DISP BLADELESS 8MM (TROCAR) ×4
TROCAR XCEL 12X100 BLDLESS (ENDOMECHANICALS) ×2 IMPLANT
TROCAR XCEL NON-BLD 5MMX100MML (ENDOMECHANICALS) ×4 IMPLANT
WATER STERILE IRR 1000ML POUR (IV SOLUTION) ×12 IMPLANT

## 2013-12-12 NOTE — Addendum Note (Signed)
Addendum created 12/12/13 1541 by Ignacia Bayley, CRNA   Modules edited: Notes Section   Notes Section:  File: [id number]

## 2013-12-12 NOTE — Brief Op Note (Signed)
12/12/2013  10:01 AM  PATIENT:  Brenda [name]  43 y.o. female  PRE-OPERATIVE DIAGNOSIS:  Dysfunctional uterine bleeding and undesired fertility  POST-OPERATIVE DIAGNOSIS:  Dysfunctional uterine bleeding and undesired fertility  PROCEDURE:  Procedure(s): ROBOTIC ASSISTED TOTAL HYSTERECTOMY WITH BILATERAL SALPINGECTOMY (Bilateral) CYSTOSCOPY (N/A)  SURGEON:  Surgeon(s) and Role:    * Lavonia Drafts, MD - Primary    * Emily Filbert, MD - Assisting  ANESTHESIA:   general  EBL:  Total I/O In: 1000 [I.V.:1000] Out: 700 [Urine:600; Blood:100]  BLOOD ADMINISTERED:none  DRAINS: none   LOCAL MEDICATIONS USED:  MARCAINE     SPECIMEN:  Source of Specimen:  uterus with cervix and bilateral fallopian tubes  DISPOSITION OF SPECIMEN:  PATHOLOGY  COUNTS:  YES  TOURNIQUET:  * No tourniquets in log *  DICTATION: .Note written in EPIC  PLAN OF CARE: prolonged observation and then discharge after 8 hours  PATIENT DISPOSITION:  PACU - hemodynamically stable.   Delay start of Pharmacological VTE agent (>24hrs) due to surgical blood loss or risk of bleeding: not applicable

## 2013-12-12 NOTE — Transfer of Care (Signed)
Immediate Anesthesia Transfer of Care Note  Patient: Monicia Sumlin  Procedure(s) Performed: Procedure(s): ROBOTIC ASSISTED TOTAL HYSTERECTOMY WITH BILATERAL SALPINGECTOMY (Bilateral) CYSTOSCOPY (N/A)  Patient Location: PACU  Anesthesia Type:General  Level of Consciousness: awake, alert  and oriented  Airway & Oxygen Therapy: Patient Spontanous Breathing and Patient connected to nasal cannula oxygen  Post-op Assessment: Report given to PACU RN, Post -op Vital signs reviewed and stable and Patient moving all extremities  Post vital signs: Reviewed and stable  Complications: No apparent anesthesia complications

## 2013-12-12 NOTE — Anesthesia Postprocedure Evaluation (Signed)
  Anesthesia Post-op Note  Patient: Brenda [name]  Procedure(s) Performed: Procedure(s): ROBOTIC ASSISTED TOTAL HYSTERECTOMY WITH BILATERAL SALPINGECTOMY (Bilateral) CYSTOSCOPY (N/A)  Patient Location: [facility]  Anesthesia Type:General  Level of Consciousness: awake  Airway and Oxygen Therapy: Patient Spontanous Breathing  Post-op Pain: mild  Post-op Assessment: Patient's Cardiovascular Status Stable and Respiratory Function Stable  Post-op Vital Signs: stable  Last Vitals:  Filed Vitals:   12/12/13 1410  BP: 139/81  Pulse: 97  Temp: 37.3 C  Resp: 16    Complications: No apparent anesthesia complications

## 2013-12-12 NOTE — Progress Notes (Signed)
Patient discharged home with family members, to car via wheel chair.  Discharged instructions reviewed, patient verbalized understanding of instructions.  Prescription given to patient.  IV removed.  Lap sites x 5 dry and intact with dried drainage noted on 2 sites.

## 2013-12-12 NOTE — H&P (Addendum)
Patient ID: Brenda [name], female    DOB: [birth year], 43 y.o.   MRN: [id number]   HPI 43 yo G2P2003 with LMP 5/24 who presents for definitive treatment for abnormal uterine bleeding. Patient describes normal monthly cycles lasting 3-4 days with the second day the heaviest with passage of clots. Patient reports a 10 day period in May.  She describes a similar episode in 2004 when she was diagnosed with endometrial polyps and underwent a D&C. Patient is otherwise without complaint.s She took Provera with no sufficient control of the bleeding.  Pt is s/p blood transfusion x 1.    Past Medical History   Diagnosis  Date   .  Endometrial polyp  2004       "polyps around the womb"       Past Surgical History   Procedure  Laterality  Date   .  Dilation and curettage of uterus    2004       for endometrial polyp   .  Cesarean section x2          Family History   Problem  Relation  Age of Onset   .  Hypertension  Maternal Grandmother         History   Substance Use Topics   .  Smoking status:  Never Smoker    .  Smokeless tobacco:  Never Used   .  Alcohol Use:  No    No Known Allergies   Review of Systems  All other systems reviewed and are negative.  No current facility-administered medications on file prior to encounter.   Current Outpatient Prescriptions on File Prior to Encounter  Medication Sig Dispense Refill  . ferrous fumarate (HEMOCYTE - 106 MG FE) 325 (106 FE) MG TABS tablet Take 1 tablet by mouth 3 (three) times daily.              Objective:     Physical Exam BP 163/88  Pulse 91  Temp(Src) 98.1 F (36.7 C) (Oral)  Resp 22  SpO2 99%  LMP 12/01/2013    GENERAL: Well-developed, well-nourished female in no acute distress.   ABDOMEN: Soft, nontender, nondistended. Obese PELVIC: Normal external female genitalia. Vagina is pink and rugated.  Normal discharge. Normal appearing cervix. Uterus is normal in size. No adnexal mass or tenderness. EXTREMITIES: No  cyanosis, clubbing, or edema, 2+ distal pulses.  CBC    Component Value Date/Time   WBC 5.9 12/06/2013 0900   RBC 3.81* 12/06/2013 0900   HGB 9.7* 12/06/2013 0900   HCT 30.7* 12/06/2013 0900   PLT 162 12/06/2013 0900   MCV 80.6 12/06/2013 0900   MCH 25.5* 12/06/2013 0900   MCHC 31.6 12/06/2013 0900   RDW 24.0* 12/06/2013 0900   LYMPHSABS 2.5 11/01/2013 1435   MONOABS 0.6 11/01/2013 1435   EOSABS 0.1 11/01/2013 1435   BASOSABS 0.0 11/01/2013 1435      6/1 Ultrasound  US Pelvis Complete   09/04/2013 CLINICAL DATA: Abnormal uterine bleeding. EXAM: TRANSABDOMINAL AND TRANSVAGINAL ULTRASOUND OF PELVIS TECHNIQUE: Both transabdominal and transvaginal ultrasound examinations of the pelvis were performed. Transabdominal technique was performed for global imaging of the pelvis including uterus, ovaries, adnexal regions, and pelvic cul-de-sac. It was necessary to proceed with endovaginal exam following the transabdominal exam to visualize the endometrium and ovaries to better advantage. COMPARISON: None FINDINGS: Uterus Measurements: 9.4 cm x 4.4 cm x 4.6 cm. An exophytic mildly hypoechoic fibroid protrudes from the uterine fundus measuring 17 mm x 14 mm  x 20 mm. There is a small mural fibroid along the anterior upper uterine segment measuring 8 mm in greatest dimension. No other uterine masses. Endometrium Thickness: 7 mm. No focal abnormality visualized. Right ovary Measurements: 4.8 cm x 3.2 cm x 2.2 cm. Dominant, 2.6 cm, simple appearing cyst. Ovary otherwise unremarkable. No adnexal masses. Left ovary Not visualized. No left adnexal mass. Other findings No free fluid. IMPRESSION: 1. Two uterine fibroids. Uterus otherwise unremarkable. Neither fibroid affects the endometrium. 2. Endometrium is normal thickness and smooth. 3. No findings to explain abnormal uterine bleeding. 4. Left ovary not visualized. No left adnexal mass. Normal right ovary with a dominant follicular cyst. Electronically Signed By: Lajean Manes M.D. On:  09/04/2013 22:27   09/28/2013 Diagnosis Endometrium, biopsy BENIGN ENDOMETRIAL POLYP AND BENIGN PROLIFERATIVE ENDOMETRIUM, NO ATYPIA, HYPERPLASIA OR MALIGNANCY.  11/10/2013 Adequacy Reason Satisfactory for evaluation, endocervical/transformation zone component PRESENT. Diagnosis NEGATIVE FOR INTRAEPITHELIAL LESIONS OR MALIGNANCY.     Assessment & Plan:    43 yo with abnormal uterine bleeding pt with anemia due to prolonged bleeding. Pt failed outpatient conservative treatment.  Dr. Elly Modena and I previously reviewed the alternatives to hysterectomy  Pt wants definitive treatment. Patient desires surgical management with robot assisted total laparoscopic hysterectomy with bilateral salpingectomy.  The risks of surgery were discussed in detail with the patient including but not limited to: bleeding which may require transfusion or reoperation; infection which may require prolonged hospitalization or re-hospitalization and antibiotic therapy; injury to bowel, bladder, ureters and major vessels or other surrounding organs; need for additional procedures including laparotomy; thromboembolic phenomenon, incisional problems and other postoperative or anesthesia complications.  Patient was told that the likelihood that her condition and symptoms will be treated effectively with this surgical management was very high; the postoperative expectations were also discussed in detail. The patient also understands the alternative treatment options which were discussed in full. All questions were answered.

## 2013-12-12 NOTE — Op Note (Signed)
12/12/2013  10:01 AM  PATIENT:  Brenda [name]  43 y.o. female  PRE-OPERATIVE DIAGNOSIS:  Dysfunctional uterine bleeding and undesired fertility  POST-OPERATIVE DIAGNOSIS:  Dysfunctional uterine bleeding and undesired fertility  PROCEDURE:  Procedure(s): ROBOTIC ASSISTED TOTAL HYSTERECTOMY WITH BILATERAL SALPINGECTOMY (Bilateral) CYSTOSCOPY (N/A)  SURGEON:  Surgeon(s) and Role:    * Lavonia Drafts, MD - Primary    * Emily Filbert, MD - Assisting  ANESTHESIA:   general  EBL:  Total I/O In: 1000 [I.V.:1000] Out: 700 [Urine:600; Blood:100]  BLOOD ADMINISTERED:none  DRAINS: none   LOCAL MEDICATIONS USED:  MARCAINE     SPECIMEN:  Source of Specimen:  uterus with cervix and bilateral fallopian tubes  DISPOSITION OF SPECIMEN:  PATHOLOGY  COUNTS:  YES  TOURNIQUET:  * No tourniquets in log *  DICTATION: .Note written in EPIC  PLAN OF CARE: prolonged observation and then discharge after 8 hours  PATIENT DISPOSITION:  PACU - hemodynamically stable.   Delay start of Pharmacological VTE agent (>24hrs) due to surgical blood loss or risk of bleeding: not applicable   The risks, benefits, and alternatives of surgery were explained, understood, and accepted. Consents were signed. All questions were answered. She was taken to the operating room and general anesthesia was applied without complication. She was placed in the dorsal lithotomy position and her abdomen and vagina were prepped and draped after she had been carefully positioned on the table. A bimanual exam revealed a 12 week size uterus that was mobile. Her adnexa were not enlarged. The cervix was measured and the uterus was sounded to 10 cm. A Rumi uterine manipulator was placed without difficulty. A Foley catheter was placed and it drained clear throughout the case. Gloves were changed and attention was turned to the abdomen. A 13mm incision was made above the umbilicus and a Veress needle was placed intraperitoneally. A  saline filled syringe was used to confirm correct placement.  CO2 was used to insufflate the abdomen to approximately 4 L. After good pneumoperitoneum was established, a 12 mm incision was was made and a 12 mm trocar was placed  ~8 cm above the umbilicus.  Laparoscopy confirmed correct placement. She was placed in Trendelenburg position and ports were placed in appropriate positions on her abdomen to allow maximum exposure during the robotic case. Specifically there was an 71mm assistant port placed in the left upper quadrant under direct laproscopic visualization. Two 8 mm ports were placed 8cm lateral to the midline port.  These were all placed under direct laparoscopic visualization. The robot was docked and I proceeded with a robotic portion of the case.  The pelvis was inspected and the uterus was found to have fibroids and be enlarged.  The fallopian tubes and ovaries were found to be normal. The remainder of her pelvis appeared normal with the exception of adhesions of bowel to the adnexa on the left side. The ureters and the infundibulopelvic ligaments were identified. I excised the fallopian tubes bilaterally. The round ligament on each side was cauterized and cut. The PK/gyrus instrument was used for this portion. The round ligaments were identified, cauterized and ligated, a bladder flap was created anteriorly. The uterine vessels were identified and cauterized and then cut.The bladder was pushed out of the operative site and an anterior colpotomy was made. The colpotomy incision was extended circumferentially, following the blue outline of the Rumi manipulator. All pedicles were hemostatic.  The uterus was removed from the vagina with the fallopian tube segments. The vaginal cuff  was closed with v-lock suture.  Excellent hemostasis was noted throughout. The pelvis was irrigated. After determining excellent hemostasis, the robot was undocked. At this point cystoscopy was performed. The cystoscopy  revealed blue dye and  ejection from both ureters.  The midline fascial incision was closed with 0 vicryl.  The skin from all of the other ports was closed with 3-0 vicryl. 30cc of 0.5% Marcaine was injected into the port sites.  The patient was then extubated and taken to recovery in stable condition.   There were no immediate complications.  Sponge, lap and needle counts were correct x 2.

## 2013-12-12 NOTE — Discharge Instructions (Signed)

## 2013-12-12 NOTE — Anesthesia Postprocedure Evaluation (Signed)
  Anesthesia Post Note  Patient: Brenda [name]  Procedure(s) Performed: Procedure(s) (LRB): ROBOTIC ASSISTED TOTAL HYSTERECTOMY WITH BILATERAL SALPINGECTOMY (Bilateral) CYSTOSCOPY (N/A)  Anesthesia type: GA  Patient location: PACU  Post pain: Pain level controlled  Post assessment: Post-op Vital signs reviewed  Last Vitals:  Filed Vitals:   12/12/13 1030  BP: 133/71  Pulse: 84  Temp:   Resp: 20    Post vital signs: Reviewed  Level of consciousness: sedated  Complications: No apparent anesthesia complications

## 2013-12-12 NOTE — Anesthesia Preprocedure Evaluation (Addendum)
Anesthesia Evaluation  Patient identified by MRN, date of birth, ID band Patient awake    Reviewed: Allergy & Precautions, H&P , Patient's Chart, lab work & pertinent test results, reviewed documented beta blocker date and time   Airway Mallampati: IV TM Distance: >3 FB Neck ROM: full    Dental no notable dental hx.    Pulmonary  breath sounds clear to auscultation  Pulmonary exam normal       Cardiovascular + Valvular Problems/Murmurs Rhythm:regular Rate:Normal     Neuro/Psych  Headaches,    GI/Hepatic   Endo/Other    Renal/GU      Musculoskeletal   Abdominal   Peds  Hematology  (+) anemia ,   Anesthesia Other Findings  Anemia        MO   Hx blood transfusion...... T&S on 12/06/13  had (-) antibody screen   Heart murmur- noticed while patient anemic, inaudible today, otherwise asymptomatic       Reproductive/Obstetrics                        Anesthesia Physical Anesthesia Plan  ASA: III  Anesthesia Plan: General   Post-op Pain Management:    Induction: Intravenous  Airway Management Planned: Oral ETT and Video Laryngoscope Planned  Additional Equipment:   Intra-op Plan:   Post-operative Plan: Extubation in OR  Informed Consent: I have reviewed the patients History and Physical, chart, labs and discussed the procedure including the risks, benefits and alternatives for the proposed anesthesia with the patient or authorized representative who has indicated his/her understanding and acceptance.   Dental Advisory Given and Dental advisory given  Plan Discussed with: CRNA and Surgeon  Anesthesia Plan Comments: (  Discussed general anesthesia, including possible nausea, instrumentation of airway, sore throat,pulmonary aspiration, etc. I asked if the were any outstanding questions, or  concerns before we proceeded. )       Anesthesia Quick Evaluation

## 2013-12-13 ENCOUNTER — Encounter (HOSPITAL_COMMUNITY): Payer: Self-pay | Admitting: Obstetrics & Gynecology

## 2013-12-13 LAB — TYPE AND SCREEN
ABO/RH(D): A POS
ANTIBODY SCREEN: NEGATIVE
Unit division: 0
Unit division: 0

## 2013-12-14 ENCOUNTER — Telehealth: Payer: Self-pay | Admitting: Obstetrics & Gynecology

## 2013-12-14 NOTE — Telephone Encounter (Signed)
T.C. to pt to check on her post op.  Left message to call back

## 2013-12-27 ENCOUNTER — Ambulatory Visit (INDEPENDENT_AMBULATORY_CARE_PROVIDER_SITE_OTHER): Payer: No Typology Code available for payment source | Admitting: Obstetrics & Gynecology

## 2013-12-27 ENCOUNTER — Encounter: Payer: Self-pay | Admitting: Obstetrics & Gynecology

## 2013-12-27 VITALS — BP 137/95 | HR 86 | Ht 67.0 in | Wt 248.3 lb

## 2013-12-27 DIAGNOSIS — Z9889 Other specified postprocedural states: Secondary | ICD-10-CM

## 2013-12-27 NOTE — Patient Instructions (Signed)

## 2013-12-27 NOTE — Progress Notes (Signed)
Subjective:     Patient ID: Brenda [name], female   DOB: July 21, 1970, 43 y.o.   MRN: [id number]  HPI Pt returns today for her 2 weeks post op check. She is without complaints. She reports normal bowel and bladder function.  She reports taking Percocet for 2 doses only.  Still takes occ Motrin.  She reports limited activity at home due to physician instructions.    Review of Systems     Objective:   Physical Exam BP 137/95  Pulse 86  Ht 5\' 7"  (1.702 m)  Wt 248 lb 4.8 oz (112.628 kg)  BMI 38.88 kg/m2  LMP 12/01/2013 Pt in NAD Abd; midline dressing still in place- removed All port sites clean and dry.  Nontender       Assessment:     2 weeks post op check.  Doing well     Plan:     Gradually increase activities. Nothing per vagina for 8 full weeks post op May RTW in 2 weeks with full activity  F/u in 4 weeks for exam of vaginal cuff

## 2014-01-03 ENCOUNTER — Encounter: Payer: Self-pay | Admitting: *Deleted

## 2014-01-03 ENCOUNTER — Telehealth: Payer: Self-pay | Admitting: *Deleted

## 2014-01-03 NOTE — Telephone Encounter (Signed)
Pt called the clinic requesting a note to return back to work.  Contacted patient and discussed that we would draft a letter for her return to work on Tuesday, October 6.  Pt verbalizes understanding, will pick up letter on October 1.

## 2014-01-29 ENCOUNTER — Ambulatory Visit: Payer: No Typology Code available for payment source | Admitting: Obstetrics & Gynecology

## 2014-01-29 ENCOUNTER — Encounter: Payer: Self-pay | Admitting: Obstetrics & Gynecology

## 2014-01-29 VITALS — BP 145/81 | HR 93 | Temp 98.9°F | Ht 67.0 in | Wt 241.7 lb

## 2014-01-29 DIAGNOSIS — Z9889 Other specified postprocedural states: Secondary | ICD-10-CM

## 2014-01-29 NOTE — Progress Notes (Signed)
Subjective:     Patient ID: Brenda [name], female   DOB: [birth year], 43 y.o.   MRN: [id number]  HPI Pt presents for post op check after a [place] with bilateral salpingectomy 12/12/2013. Pt denies problems.  She is voiding and passing stools without difficulty.  She denies being sexually active.  She reports that she eats "a bag of chips, a candy bar, a slim Jim and fruit punch" for food but, has stopped drinking sodas.  Review of Systems     Objective:   Physical Exam BP 145/81  Pulse 93  Temp(Src) 98.9 F (37.2 C) (Oral)  Ht 5\' 7"  (1.702 m)  Wt 241 lb 11.2 oz (109.634 kg)  BMI 37.85 kg/m2  LMP 12/01/2013 Pt in NAD  12/12/2013 Diagnosis Uterus and bilateral fallopian tubes, with cervix - CERVIX: UNREMARKABLE. - ENDOMETRIUM: PROLIFERATIVE. NO EVIDENCE OF HYPERPLASIA OR CARCINOMA. - MYOMETRIUM: LEIOMYOMATA. - RIGHT FALLOPIAN TUBE: FINDINGS CONSISTENT WITH PREVIUS TUBAL LIGATION. - LEFT FALLOPIAN TUBE: FINDINGS CONSISTENT WITH PREVIOUS TUBAL LIGATION.     Assessment:     6 week post op check. Doing well. BP elevated- reviewed diet and exercise      Plan:     Reviewed weight loss and exercise Rec [employer] Return to full activities No intercourse for another 2 weeks F/u in 3 months or sooner prn

## 2014-01-29 NOTE — Patient Instructions (Addendum)
Total Laparoscopic Hysterectomy, Care After Refer to this sheet in the next few weeks. These instructions provide you with information on caring for yourself after your procedure. Your health care provider may also give you more specific instructions. Your treatment has been planned according to current medical practices, but problems sometimes occur. Call your health care provider if you have any problems or questions after your procedure. WHAT TO EXPECT AFTER THE PROCEDURE  Pain and bruising at the incision sites. You will be given pain medicine to control it.  Menopausal symptoms such as hot flashes, night sweats, and insomnia if your ovaries were removed.  Sore throat from the breathing tube that was inserted during surgery. HOME CARE INSTRUCTIONS  Only take over-the-counter or prescription medicines for pain, discomfort, or fever as directed by your health care provider.   Do not take aspirin. It can cause bleeding.   Do not drive when taking pain medicine.   Follow your health care provider's advice regarding diet, exercise, lifting, driving, and general activities.   Resume your usual diet as directed and allowed.   Get plenty of rest and sleep.   Do not douche, use tampons, or have sexual intercourse for at least 6 weeks, or until your health care provider gives you permission.   Change your bandages (dressings) as directed by your health care provider.   Monitor your temperature and notify your health care provider of a fever.   Take showers instead of baths for 2-3 weeks.   Do not drink alcohol until your health care provider gives you permission.   If you develop constipation, you may take a mild laxative with your health care provider's permission. Bran foods may help with constipation problems. Drinking enough fluids to keep your urine clear or pale yellow may help as well.   Try to have someone home with you for 1-2 weeks to help around the house.    Keep all of your follow-up appointments as directed by your health care provider.  SEEK MEDICAL CARE IF:  You have swelling, redness, or increasing pain around your incision sites.   You have pus coming from your incision.   You notice a bad smell coming from your incision.   Your incision breaks open.   You feel dizzy or lightheaded.   You have pain or bleeding when you urinate.   You have persistent diarrhea.   You have persistent nausea and vomiting.   You have abnormal vaginal discharge.   You have a rash.   You have any type of abnormal reaction or develop an allergy to your medicine.   You have poor pain control with your prescribed medicine.  SEEK IMMEDIATE MEDICAL CARE IF:  You have chest pain or shortness of breath.  You have severe abdominal pain that is not relieved with pain medicine.  You have pain or swelling in your legs. MAKE SURE YOU:  Understand these instructions.  Will watch your condition.  Will get help right away if you are not doing well or get worse. Document Released: 01/11/2013 Document Revised: 03/28/2013 Document Reviewed: 01/11/2013 [facility] Patient Information 2015 [place], [place]. This information is not intended to replace advice given to you by your health care provider. Make sure you discuss any questions you have with your health care provider. Hypertension Hypertension, commonly called high blood pressure, is when the force of blood pumping through your arteries is too strong. Your arteries are the blood vessels that carry blood from your heart throughout your body. A  blood pressure reading consists of a higher number over a lower number, such as 110/72. The higher number (systolic) is the pressure inside your arteries when your heart pumps. The lower number (diastolic) is the pressure inside your arteries when your heart relaxes. Ideally you want your blood pressure below 120/80. Hypertension forces your heart to work  harder to pump blood. Your arteries may become narrow or stiff. Having hypertension puts you at risk for heart disease, stroke, and other problems.  RISK FACTORS Some risk factors for high blood pressure are controllable. Others are not.  Risk factors you cannot control include:   Race. You may be at higher risk if you are African American.  Age. Risk increases with age.  Gender. Men are at higher risk than women before age 1 years. After age 2, women are at higher risk than men. Risk factors you can control include:  Not getting enough exercise or physical activity.  Being overweight.  Getting too much fat, sugar, calories, or salt in your diet.  Drinking too much alcohol. SIGNS AND SYMPTOMS Hypertension does not usually cause signs or symptoms. Extremely high blood pressure (hypertensive crisis) may cause headache, anxiety, shortness of breath, and nosebleed. DIAGNOSIS  To check if you have hypertension, your health care provider will measure your blood pressure while you are seated, with your arm held at the level of your heart. It should be measured at least twice using the same arm. Certain conditions can cause a difference in blood pressure between your right and left arms. A blood pressure reading that is higher than normal on one occasion does not mean that you need treatment. If one blood pressure reading is high, ask your health care provider about having it checked again. TREATMENT  Treating high blood pressure includes making lifestyle changes and possibly taking medicine. Living a healthy lifestyle can help lower high blood pressure. You may need to change some of your habits. Lifestyle changes may include:  Following the DASH diet. This diet is high in fruits, vegetables, and whole grains. It is low in salt, red meat, and added sugars.  Getting at least 2 hours of brisk physical activity every week.  Losing weight if necessary.  Not smoking.  Limiting alcoholic  beverages.  Learning ways to reduce stress. If lifestyle changes are not enough to get your blood pressure under control, your health care provider may prescribe medicine. You may need to take more than one. Work closely with your health care provider to understand the risks and benefits. HOME CARE INSTRUCTIONS  Have your blood pressure rechecked as directed by your health care provider.   Take medicines only as directed by your health care provider. Follow the directions carefully. Blood pressure medicines must be taken as prescribed. The medicine does not work as well when you skip doses. Skipping doses also puts you at risk for problems.   Do not smoke.   Monitor your blood pressure at home as directed by your health care provider. SEEK MEDICAL CARE IF:   You think you are having a reaction to medicines taken.  You have recurrent headaches or feel dizzy.  You have swelling in your ankles.  You have trouble with your vision. SEEK IMMEDIATE MEDICAL CARE IF:  You develop a severe headache or confusion.  You have unusual weakness, numbness, or feel faint.  You have severe chest or abdominal pain.  You vomit repeatedly.  You have trouble breathing. MAKE SURE YOU:   Understand these instructions.  Will watch your condition.  Will get help right away if you are not doing well or get worse. Document Released: 03/23/2005 Document Revised: 08/07/2013 Document Reviewed: 01/13/2013 [facility] Patient Information 2015 [place], [place]. This information is not intended to replace advice given to you by your health care provider. Make sure you discuss any questions you have with your health care provider.

## 2014-02-05 ENCOUNTER — Encounter: Payer: Self-pay | Admitting: Obstetrics & Gynecology

## 2014-02-11 ENCOUNTER — Encounter (HOSPITAL_COMMUNITY): Payer: Self-pay | Admitting: *Deleted

## 2014-02-11 ENCOUNTER — Inpatient Hospital Stay (HOSPITAL_COMMUNITY)
Admission: AD | Admit: 2014-02-11 | Discharge: 2014-02-17 | DRG: 689 | Disposition: A | Discharge status: Home / Self Care | Payer: No Typology Code available for payment source | Source: Ambulatory Visit | Attending: Internal Medicine | Admitting: Internal Medicine

## 2014-02-11 ENCOUNTER — Inpatient Hospital Stay (HOSPITAL_COMMUNITY): Payer: No Typology Code available for payment source

## 2014-02-11 DIAGNOSIS — R0902 Hypoxemia: Secondary | ICD-10-CM

## 2014-02-11 DIAGNOSIS — R1030 Lower abdominal pain, unspecified: Secondary | ICD-10-CM

## 2014-02-11 DIAGNOSIS — E669 Obesity, unspecified: Secondary | ICD-10-CM | POA: Diagnosis present

## 2014-02-11 DIAGNOSIS — Z90710 Acquired absence of both cervix and uterus: Secondary | ICD-10-CM

## 2014-02-11 DIAGNOSIS — N3000 Acute cystitis without hematuria: Principal | ICD-10-CM | POA: Diagnosis present

## 2014-02-11 DIAGNOSIS — R1084 Generalized abdominal pain: Secondary | ICD-10-CM | POA: Diagnosis present

## 2014-02-11 DIAGNOSIS — D649 Anemia, unspecified: Secondary | ICD-10-CM | POA: Diagnosis present

## 2014-02-11 DIAGNOSIS — I2699 Other pulmonary embolism without acute cor pulmonale: Secondary | ICD-10-CM

## 2014-02-11 DIAGNOSIS — N390 Urinary tract infection, site not specified: Secondary | ICD-10-CM | POA: Diagnosis present

## 2014-02-11 DIAGNOSIS — R509 Fever, unspecified: Secondary | ICD-10-CM

## 2014-02-11 DIAGNOSIS — R109 Unspecified abdominal pain: Secondary | ICD-10-CM

## 2014-02-11 DIAGNOSIS — A09 Infectious gastroenteritis and colitis, unspecified: Secondary | ICD-10-CM | POA: Diagnosis present

## 2014-02-11 DIAGNOSIS — Z79891 Long term (current) use of opiate analgesic: Secondary | ICD-10-CM

## 2014-02-11 DIAGNOSIS — K760 Fatty (change of) liver, not elsewhere classified: Secondary | ICD-10-CM | POA: Diagnosis present

## 2014-02-11 DIAGNOSIS — Z791 Long term (current) use of non-steroidal anti-inflammatories (NSAID): Secondary | ICD-10-CM

## 2014-02-11 DIAGNOSIS — R000 Tachycardia, unspecified: Secondary | ICD-10-CM | POA: Diagnosis present

## 2014-02-11 DIAGNOSIS — I10 Essential (primary) hypertension: Secondary | ICD-10-CM | POA: Diagnosis present

## 2014-02-11 DIAGNOSIS — Z6838 Body mass index (BMI) 38.0-38.9, adult: Secondary | ICD-10-CM

## 2014-02-11 DIAGNOSIS — J189 Pneumonia, unspecified organism: Secondary | ICD-10-CM | POA: Diagnosis present

## 2014-02-11 HISTORY — DX: Essential (primary) hypertension: I10

## 2014-02-11 LAB — COMPREHENSIVE METABOLIC PANEL
ALK PHOS: 91 U/L (ref 39–117)
ALT: 13 U/L (ref 0–35)
AST: 16 U/L (ref 0–37)
Albumin: 3.6 g/dL (ref 3.5–5.2)
Anion gap: 13 (ref 5–15)
BUN: 7 mg/dL (ref 6–23)
CALCIUM: 8.8 mg/dL (ref 8.4–10.5)
CO2: 26 mEq/L (ref 19–32)
Chloride: 95 mEq/L — ABNORMAL LOW (ref 96–112)
Creatinine, Ser: 0.96 mg/dL (ref 0.50–1.10)
GFR calc non Af Amer: 71 mL/min — ABNORMAL LOW (ref >90)
GFR, EST AFRICAN AMERICAN: 83 mL/min — AB (ref >90)
GLUCOSE: 116 mg/dL — AB (ref 70–99)
POTASSIUM: 4.1 meq/L (ref 3.7–5.3)
SODIUM: 134 meq/L — AB (ref 137–147)
Total Bilirubin: 0.8 mg/dL (ref 0.3–1.2)
Total Protein: 7.6 g/dL (ref 6.0–8.3)

## 2014-02-11 LAB — CBC
HCT: 35.1 % — ABNORMAL LOW (ref 36.0–46.0)
HEMOGLOBIN: 11.4 g/dL — AB (ref 12.0–15.0)
MCH: 27.7 pg (ref 26.0–34.0)
MCHC: 32.5 g/dL (ref 30.0–36.0)
MCV: 85.4 fL (ref 78.0–100.0)
PLATELETS: 253 10*3/uL (ref 150–400)
RBC: 4.11 MIL/uL (ref 3.87–5.11)
RDW: 14.6 % (ref 11.5–15.5)
WBC: 16.3 10*3/uL — ABNORMAL HIGH (ref 4.0–10.5)

## 2014-02-11 LAB — URINE MICROSCOPIC-ADD ON

## 2014-02-11 LAB — URINALYSIS, ROUTINE W REFLEX MICROSCOPIC
BILIRUBIN URINE: NEGATIVE
GLUCOSE, UA: NEGATIVE mg/dL
Ketones, ur: 15 mg/dL — AB
Nitrite: NEGATIVE
Protein, ur: 30 mg/dL — AB
SPECIFIC GRAVITY, URINE: 1.02 (ref 1.005–1.030)
UROBILINOGEN UA: 0.2 mg/dL (ref 0.0–1.0)
pH: 8 (ref 5.0–8.0)

## 2014-02-11 MED ORDER — HYDROMORPHONE HCL 1 MG/ML IJ SOLN
1.0000 mg | Freq: Once | INTRAMUSCULAR | Status: AC
  Administered 2014-02-11: 1 mg via INTRAVENOUS
  Filled 2014-02-11: qty 1

## 2014-02-11 MED ORDER — SODIUM CHLORIDE 0.9 % IV BOLUS (SEPSIS)
1000.0000 mL | Freq: Once | INTRAVENOUS | Status: AC
  Administered 2014-02-12: 1000 mL via INTRAVENOUS

## 2014-02-11 MED ORDER — ACETAMINOPHEN 500 MG PO TABS
1000.0000 mg | ORAL_TABLET | Freq: Once | ORAL | Status: AC
  Administered 2014-02-11: 1000 mg via ORAL
  Filled 2014-02-11: qty 2

## 2014-02-11 MED ORDER — KETOROLAC TROMETHAMINE 60 MG/2ML IM SOLN
60.0000 mg | Freq: Once | INTRAMUSCULAR | Status: AC
  Administered 2014-02-11: 60 mg via INTRAMUSCULAR
  Filled 2014-02-11: qty 2

## 2014-02-11 MED ORDER — LACTATED RINGERS IV SOLN
INTRAVENOUS | Status: DC
  Administered 2014-02-11: 20:00:00 via INTRAVENOUS

## 2014-02-11 MED ORDER — PROMETHAZINE HCL 25 MG/ML IJ SOLN
12.5000 mg | Freq: Once | INTRAMUSCULAR | Status: AC
  Administered 2014-02-11: 12.5 mg via INTRAVENOUS
  Filled 2014-02-11: qty 1

## 2014-02-11 MED ORDER — DEXTROSE 5 % IV SOLN
1.0000 g | Freq: Once | INTRAVENOUS | Status: AC
  Administered 2014-02-11: 1 g via INTRAVENOUS
  Filled 2014-02-11: qty 10

## 2014-02-11 MED ORDER — IOHEXOL 300 MG/ML  SOLN: 50.0000 mL | INTRAMUSCULAR | Status: AC

## 2014-02-11 MED ORDER — IOHEXOL 300 MG/ML  SOLN
100.0000 mL | Freq: Once | INTRAMUSCULAR | Status: AC | PRN
  Administered 2014-02-11: 100 mL via INTRAVENOUS

## 2014-02-11 NOTE — MAU Provider Note (Signed)
History     CSN: [id number]  Arrival date and time: 02/11/14 1742   First Provider Initiated Contact with Patient 02/11/14 1831      Chief Complaint  Patient presents with  . Abdominal Pain   HPI Brenda [name] 43 y.o. Had robotic surgery for hysterectomy and bilateral salpingectomy on 12-12-13.  Went for post op visit at 2 weeks and at 20 weeks and was progressing well.  Yesterday began having problems with abdominal pain after having sex for the first time since surgery.  She was having problems with abdominal pain and constipation.  Did not associate the pain she was having with the intercourse.  She had no BM from Wednesday to Saturday.  Also was visiting in [place] and took Mag Citrate yesterday and has had 3 stools with brown liquid and some balls of stool.  Has continued to have abdominal pain and low back pain.  Urine is concentrated with odor.  Took Ibuprofen and one percocet yesterday but the pain was not relieved and is worse today.  [place] home today with family.  Is not feeling well.  Had only 2 spoons of potatoes today and has not eaten anything else.  Has been drinking water.  Has not had vomiting.  OB History    Gravida Para Term Preterm AB TAB SAB Ectopic Multiple Living   2 2 2      1 3       Past Medical History  Diagnosis Date  . Endometrial polyp 2004    "polyps around the womb"  . Anemia   . Abnormal menstrual cycle     "been bleeding since 08/29/2013" (11/01/2013)  . History of blood transfusion 11/01/2013    related to abnormal menstrual cycle  . OACZYSAY(301.6)     "weekly" (11/01/2013)  . Heart murmur     Past Surgical History  Procedure Laterality Date  . Cesarean section  1994  . Dilation and curettage of uterus  2004    for endometrial polyp  . Tubal ligation  1994  . Robotic assisted total hysterectomy Bilateral 12/12/2013    Procedure: ROBOTIC ASSISTED TOTAL HYSTERECTOMY WITH BILATERAL SALPINGECTOMY;  Surgeon: Lavonia Drafts, MD;  Location: [facility];  Service: Gynecology;  Laterality: Bilateral;  . Cystoscopy N/A 12/12/2013    Procedure: CYSTOSCOPY;  Surgeon: Lavonia Drafts, MD;  Location: [facility];  Service: Gynecology;  Laterality: N/A;    Family History  Problem Relation Age of Onset  . Hypertension Maternal Grandmother     History  Substance Use Topics  . Smoking status: Never Smoker   . Smokeless tobacco: Never Used  . Alcohol Use: No    Allergies: No Known Allergies  Prescriptions prior to admission  Medication Sig Dispense Refill Last Dose  . ferrous fumarate (HEMOCYTE - 106 MG FE) 325 (106 FE) MG TABS tablet Take 1 tablet by mouth 3 (three) times daily.   Taking  . ibuprofen (ADVIL,MOTRIN) 600 MG tablet Take 1 tablet (600 mg total) by mouth every 6 (six) hours as needed. 30 tablet 1 Taking  . oxyCODONE-acetaminophen (PERCOCET/ROXICET) 5-325 MG per tablet Take 1-2 tablets by mouth every 6 (six) hours as needed. 30 tablet 0 Not Taking    Review of Systems  Constitutional: Negative for fever.  Gastrointestinal: Positive for nausea, abdominal pain and constipation. Negative for vomiting.  Genitourinary:       No vaginal discharge. No vaginal bleeding. Pressure with urination.   Physical Exam   Last menstrual period 12/01/2013.  Physical Exam  Nursing note and vitals reviewed. Constitutional: She is oriented to person, place, and time. She appears well-developed and well-nourished.  obese  HENT:  Head: Normocephalic.  Eyes: EOM are normal.  Neck: Neck supple.  GI: There is tenderness. There is guarding. There is no rebound.  Upper abdomen is tight.  Lower abdomen is softer.  Pain is worst in upper left quadrant with palpation.  Musculoskeletal: Normal range of motion.  Neurological: She is alert and oriented to person, place, and time.  Skin: Skin is warm and dry.  Psychiatric: She has a normal mood and affect.   Results for orders placed or performed during the hospital encounter of 02/11/14 (from  the past 24 hour(s))  Urinalysis, Routine w reflex microscopic     Status: Abnormal   Collection Time: 02/11/14  6:20 PM  Result Value Ref Range   Color, Urine YELLOW YELLOW   APPearance CLEAR CLEAR   Specific Gravity, Urine 1.020 1.005 - 1.030   pH 8.0 5.0 - 8.0   Glucose, UA NEGATIVE NEGATIVE mg/dL   Hgb urine dipstick SMALL (A) NEGATIVE   Bilirubin Urine NEGATIVE NEGATIVE   Ketones, ur 15 (A) NEGATIVE mg/dL   Protein, ur 30 (A) NEGATIVE mg/dL   Urobilinogen, UA 0.2 0.0 - 1.0 mg/dL   Nitrite NEGATIVE NEGATIVE   Leukocytes, UA SMALL (A) NEGATIVE  Urine microscopic-add on     Status: Abnormal   Collection Time: 02/11/14  6:20 PM  Result Value Ref Range   Squamous Epithelial / LPF MANY (A) RARE   WBC, UA 21-50 <3 WBC/hpf   RBC / HPF 3-6 <3 RBC/hpf   Bacteria, UA MANY (A) RARE  CBC     Status: Abnormal   Collection Time: 02/11/14  7:00 PM  Result Value Ref Range   WBC 16.3 (H) 4.0 - 10.5 K/uL   RBC 4.11 3.87 - 5.11 MIL/uL   Hemoglobin 11.4 (L) 12.0 - 15.0 g/dL   HCT 35.1 (L) 36.0 - 46.0 %   MCV 85.4 78.0 - 100.0 fL   MCH 27.7 26.0 - 34.0 pg   MCHC 32.5 30.0 - 36.0 g/dL   RDW 14.6 11.5 - 15.5 %   Platelets 253 150 - 400 K/uL  Comprehensive metabolic panel     Status: Abnormal   Collection Time: 02/11/14  7:00 PM  Result Value Ref Range   Sodium 134 (L) 137 - 147 mEq/L   Potassium 4.1 3.7 - 5.3 mEq/L   Chloride 95 (L) 96 - 112 mEq/L   CO2 26 19 - 32 mEq/L   Glucose, Bld 116 (H) 70 - 99 mg/dL   BUN 7 6 - 23 mg/dL   Creatinine, Ser 0.96 0.50 - 1.10 mg/dL   Calcium 8.8 8.4 - 10.5 mg/dL   Total Protein 7.6 6.0 - 8.3 g/dL   Albumin 3.6 3.5 - 5.2 g/dL   AST 16 0 - 37 U/L   ALT 13 0 - 35 U/L   Alkaline Phosphatase 91 39 - 117 U/L   Total Bilirubin 0.8 0.3 - 1.2 mg/dL   GFR calc non Af Amer 71 (L) >90 mL/min   GFR calc Af Amer 83 (L) >90 mL/min   Anion gap 13 5 - 15   Ct Abdomen Pelvis W Contrast  02/11/2014   CLINICAL DATA:  Pt had abdominal hysterectomy on Sep 8th 2015  without any complications. Pt started yesterday with lower abd pain, took 1200mg  ibuprofen for pain yesterday with minimal relief. Pressure when she voids, noticed  a strong odor with urine and a dark color. Fever.  EXAM: CT ABDOMEN AND PELVIS WITH CONTRAST  TECHNIQUE: Multidetector CT imaging of the abdomen and pelvis was performed using the standard protocol following bolus administration of intravenous contrast.  CONTRAST:  141mL OMNIPAQUE IOHEXOL 300 MG/ML  SOLN  COMPARISON:  None.  FINDINGS: Bladder is only mildly distended. The wall appears thickened with some hazy stranding in the perivesicular fat. Additional fat stranding is seen adjacent to the ovaries and adnexa. No pelvic fluid collection is seen to suggest an abscess. Uterus is surgically absent.  There are air-fluid levels within the small bowel and colon. No bowel wall thickening. No bowel dilation is seen to suggest obstruction. This suggests a mild diffuse adynamic ileus.  Lungs show mild basilar atelectasis.  Heart is normal in size.  Fatty infiltration of the liver.  No liver mass or focal lesion.  Spleen, gallbladder, pancreas, adrenal glands: Normal. Normal kidneys and ureters.  No pathologically enlarged lymph nodes.  No ascites.  Mild loss of disc height at L4-L5. Bony structures otherwise unremarkable.  IMPRESSION: 1. Bladder wall thickening with adjacent fat inflammation. Findings are consistent with cystitis. 2. No abnormal fluid collections. No evidence of a postoperative abscess. 3. Air-fluid levels within the colon and small bowel with no evidence of bowel wall thickening or inflammation. Findings are consistent with a mild adynamic ileus. 4. Hepatic steatosis.   Electronically Signed   By: Lajean Manes M.D.   On: 02/11/2014 21:19    MAU Course  Procedures  MDM 31  Consult with Dr. Kennon Rounds - CT planned 2000  Care assumed by H. Norman Herrlich, [place]: Reviewed CT with Dr. Kennon Rounds, will treat with 1g rocephin here in MAU and send home with  Cipro. FU with the clinic later this week.  2250: D.W Dr. Kennon Rounds, transfer to [place]: D.W Dr. Wyvonnia Dusky at [facility], accepts transfer   Assessment and Plan   1. Fever, unspecified fever cause   2. Abdominal pain of unknown cause, unspecified laterality    Transfer to [facility] for further evaluation and treatment   BURLESON,TERRI 02/11/2014, 6:36 PM

## 2014-02-11 NOTE — MAU Note (Signed)
Pt had abdominal hysterectomy on Sep 8th 2015 without any complications.  Pt started yesterday with lower abd pain, took 1200mg  ibuprofen for pain yesterday with minimal relief.  Pressure when she voids, noticed a strong odor with urine and a dark color.

## 2014-02-11 NOTE — ED Notes (Addendum)
[facility]d pain that started a day ago. Denies N/V, states diarrhea yesterday.

## 2014-02-11 NOTE — MAU Note (Signed)
Pt has intercourse yesterday, and she reports that is when this lower abd pain started.

## 2014-02-12 ENCOUNTER — Inpatient Hospital Stay (HOSPITAL_COMMUNITY): Payer: No Typology Code available for payment source

## 2014-02-12 ENCOUNTER — Encounter (HOSPITAL_COMMUNITY): Payer: Self-pay | Admitting: Internal Medicine

## 2014-02-12 ENCOUNTER — Emergency Department (HOSPITAL_COMMUNITY): Payer: No Typology Code available for payment source

## 2014-02-12 DIAGNOSIS — Z79891 Long term (current) use of opiate analgesic: Secondary | ICD-10-CM | POA: Diagnosis not present

## 2014-02-12 DIAGNOSIS — Z6838 Body mass index (BMI) 38.0-38.9, adult: Secondary | ICD-10-CM | POA: Diagnosis not present

## 2014-02-12 DIAGNOSIS — R0902 Hypoxemia: Secondary | ICD-10-CM | POA: Insufficient documentation

## 2014-02-12 DIAGNOSIS — R109 Unspecified abdominal pain: Secondary | ICD-10-CM | POA: Insufficient documentation

## 2014-02-12 DIAGNOSIS — Z90710 Acquired absence of both cervix and uterus: Secondary | ICD-10-CM | POA: Diagnosis not present

## 2014-02-12 DIAGNOSIS — E669 Obesity, unspecified: Secondary | ICD-10-CM | POA: Diagnosis present

## 2014-02-12 DIAGNOSIS — A09 Infectious gastroenteritis and colitis, unspecified: Secondary | ICD-10-CM | POA: Diagnosis present

## 2014-02-12 DIAGNOSIS — R509 Fever, unspecified: Secondary | ICD-10-CM

## 2014-02-12 DIAGNOSIS — I10 Essential (primary) hypertension: Secondary | ICD-10-CM | POA: Diagnosis present

## 2014-02-12 DIAGNOSIS — K760 Fatty (change of) liver, not elsewhere classified: Secondary | ICD-10-CM | POA: Diagnosis present

## 2014-02-12 DIAGNOSIS — R000 Tachycardia, unspecified: Secondary | ICD-10-CM | POA: Diagnosis present

## 2014-02-12 DIAGNOSIS — R1030 Lower abdominal pain, unspecified: Secondary | ICD-10-CM

## 2014-02-12 DIAGNOSIS — Z791 Long term (current) use of non-steroidal anti-inflammatories (NSAID): Secondary | ICD-10-CM | POA: Diagnosis not present

## 2014-02-12 DIAGNOSIS — J189 Pneumonia, unspecified organism: Secondary | ICD-10-CM | POA: Diagnosis present

## 2014-02-12 DIAGNOSIS — N390 Urinary tract infection, site not specified: Secondary | ICD-10-CM | POA: Diagnosis present

## 2014-02-12 DIAGNOSIS — R1084 Generalized abdominal pain: Secondary | ICD-10-CM | POA: Diagnosis present

## 2014-02-12 DIAGNOSIS — D649 Anemia, unspecified: Secondary | ICD-10-CM | POA: Diagnosis present

## 2014-02-12 DIAGNOSIS — N3000 Acute cystitis without hematuria: Secondary | ICD-10-CM | POA: Diagnosis present

## 2014-02-12 LAB — CBC WITH DIFFERENTIAL/PLATELET
Basophils Absolute: 0 10*3/uL (ref 0.0–0.1)
Basophils Relative: 0 % (ref 0–1)
Eosinophils Absolute: 0 10*3/uL (ref 0.0–0.7)
Eosinophils Relative: 0 % (ref 0–5)
HEMATOCRIT: 33.4 % — AB (ref 36.0–46.0)
Hemoglobin: 10.4 g/dL — ABNORMAL LOW (ref 12.0–15.0)
LYMPHS ABS: 1.3 10*3/uL (ref 0.7–4.0)
LYMPHS PCT: 10 % — AB (ref 12–46)
MCH: 26.9 pg (ref 26.0–34.0)
MCHC: 31.1 g/dL (ref 30.0–36.0)
MCV: 86.3 fL (ref 78.0–100.0)
Monocytes Absolute: 0.9 10*3/uL (ref 0.1–1.0)
Monocytes Relative: 7 % (ref 3–12)
NEUTROS ABS: 10 10*3/uL — AB (ref 1.7–7.7)
Neutrophils Relative %: 83 % — ABNORMAL HIGH (ref 43–77)
Platelets: 207 10*3/uL (ref 150–400)
RBC: 3.87 MIL/uL (ref 3.87–5.11)
RDW: 14.7 % (ref 11.5–15.5)
WBC: 12.2 10*3/uL — AB (ref 4.0–10.5)

## 2014-02-12 LAB — COMPREHENSIVE METABOLIC PANEL
ALT: 10 U/L (ref 0–35)
AST: 12 U/L (ref 0–37)
Albumin: 3 g/dL — ABNORMAL LOW (ref 3.5–5.2)
Alkaline Phosphatase: 84 U/L (ref 39–117)
Anion gap: 13 (ref 5–15)
BILIRUBIN TOTAL: 0.6 mg/dL (ref 0.3–1.2)
BUN: 6 mg/dL (ref 6–23)
CALCIUM: 8.1 mg/dL — AB (ref 8.4–10.5)
CHLORIDE: 100 meq/L (ref 96–112)
CO2: 23 meq/L (ref 19–32)
Creatinine, Ser: 0.89 mg/dL (ref 0.50–1.10)
GFR, EST NON AFRICAN AMERICAN: 78 mL/min — AB (ref >90)
GLUCOSE: 133 mg/dL — AB (ref 70–99)
Potassium: 3.6 mEq/L — ABNORMAL LOW (ref 3.7–5.3)
SODIUM: 136 meq/L — AB (ref 137–147)
Total Protein: 7.3 g/dL (ref 6.0–8.3)

## 2014-02-12 LAB — TSH: TSH: 3.4 u[IU]/mL (ref 0.350–4.500)

## 2014-02-12 LAB — TROPONIN I
Troponin I: <0.3 ng/mL (ref <0.30)
Troponin I: <0.3 ng/mL (ref <0.30)

## 2014-02-12 LAB — D-DIMER, QUANTITATIVE: D-Dimer, Quant: 2.97 ug/mL-FEU — ABNORMAL HIGH (ref 0.00–0.48)

## 2014-02-12 LAB — I-STAT CG4 LACTIC ACID, ED: Lactic Acid, Venous: 0.63 mmol/L (ref 0.5–2.2)

## 2014-02-12 LAB — HEMOGLOBIN A1C
Hgb A1c MFr Bld: 5.7 % — ABNORMAL HIGH (ref <5.7)
Mean Plasma Glucose: 117 mg/dL — ABNORMAL HIGH (ref <117)

## 2014-02-12 LAB — LIPASE, BLOOD: LIPASE: 14 U/L (ref 11–59)

## 2014-02-12 MED ORDER — SODIUM CHLORIDE 0.9 % IV BOLUS (SEPSIS)
500.0000 mL | Freq: Once | INTRAVENOUS | Status: AC
  Administered 2014-02-12: 500 mL via INTRAVENOUS

## 2014-02-12 MED ORDER — OXYCODONE-ACETAMINOPHEN 5-325 MG PO TABS
1.0000 | ORAL_TABLET | Freq: Four times a day (QID) | ORAL | Status: DC | PRN
  Administered 2014-02-12 – 2014-02-16 (×11): 2 via ORAL
  Filled 2014-02-12 (×11): qty 2

## 2014-02-12 MED ORDER — DOCUSATE SODIUM 100 MG PO CAPS
100.0000 mg | ORAL_CAPSULE | Freq: Two times a day (BID) | ORAL | Status: DC | PRN
  Administered 2014-02-13: 100 mg via ORAL
  Filled 2014-02-12: qty 1

## 2014-02-12 MED ORDER — ONDANSETRON HCL 4 MG/2ML IJ SOLN
4.0000 mg | Freq: Four times a day (QID) | INTRAMUSCULAR | Status: DC | PRN
  Administered 2014-02-13 – 2014-02-17 (×3): 4 mg via INTRAVENOUS
  Filled 2014-02-12 (×3): qty 2

## 2014-02-12 MED ORDER — SODIUM CHLORIDE 0.9 % IJ SOLN
3.0000 mL | Freq: Two times a day (BID) | INTRAMUSCULAR | Status: DC
  Administered 2014-02-12 – 2014-02-17 (×8): 3 mL via INTRAVENOUS

## 2014-02-12 MED ORDER — IOHEXOL 350 MG/ML SOLN
75.0000 mL | Freq: Once | INTRAVENOUS | Status: AC | PRN
  Administered 2014-02-12: 75 mL via INTRAVENOUS

## 2014-02-12 MED ORDER — DEXTROSE 5 % IV SOLN
1.0000 g | INTRAVENOUS | Status: DC
  Administered 2014-02-12: 1 g via INTRAVENOUS
  Filled 2014-02-12: qty 10

## 2014-02-12 MED ORDER — METOPROLOL TARTRATE 1 MG/ML IV SOLN
5.0000 mg | Freq: Once | INTRAVENOUS | Status: AC
  Administered 2014-02-12: 5 mg via INTRAVENOUS
  Filled 2014-02-12: qty 5

## 2014-02-12 MED ORDER — SODIUM CHLORIDE 0.9 % IV SOLN
INTRAVENOUS | Status: AC
  Administered 2014-02-12: 03:00:00 via INTRAVENOUS

## 2014-02-12 MED ORDER — ACETAMINOPHEN 325 MG PO TABS
650.0000 mg | ORAL_TABLET | Freq: Four times a day (QID) | ORAL | Status: DC | PRN
  Administered 2014-02-13: 650 mg via ORAL
  Filled 2014-02-12: qty 2

## 2014-02-12 MED ORDER — GI COCKTAIL ~~LOC~~
30.0000 mL | Freq: Once | ORAL | Status: AC
  Administered 2014-02-12: 30 mL via ORAL
  Filled 2014-02-12: qty 30

## 2014-02-12 MED ORDER — ENOXAPARIN SODIUM 40 MG/0.4ML ~~LOC~~ SOLN
40.0000 mg | SUBCUTANEOUS | Status: DC
  Administered 2014-02-12 – 2014-02-16 (×5): 40 mg via SUBCUTANEOUS
  Filled 2014-02-12 (×6): qty 0.4

## 2014-02-12 MED ORDER — ONDANSETRON HCL 4 MG PO TABS
4.0000 mg | ORAL_TABLET | Freq: Four times a day (QID) | ORAL | Status: DC | PRN
  Administered 2014-02-14 – 2014-02-15 (×2): 4 mg via ORAL
  Filled 2014-02-12 (×2): qty 1

## 2014-02-12 MED ORDER — GI COCKTAIL ~~LOC~~
30.0000 mL | Freq: Once | ORAL | Status: AC
  Administered 2014-02-13: 30 mL via ORAL
  Filled 2014-02-12: qty 30

## 2014-02-12 MED ORDER — FERROUS FUMARATE 325 (106 FE) MG PO TABS
1.0000 | ORAL_TABLET | Freq: Three times a day (TID) | ORAL | Status: DC
  Administered 2014-02-12 – 2014-02-17 (×14): 106 mg via ORAL
  Filled 2014-02-12 (×19): qty 1

## 2014-02-12 MED ORDER — ACETAMINOPHEN 650 MG RE SUPP: 650.0000 mg | Freq: Four times a day (QID) | RECTAL | Status: DC | PRN

## 2014-02-12 MED ORDER — METOPROLOL TARTRATE 1 MG/ML IV SOLN
5.0000 mg | Freq: Once | INTRAVENOUS | Status: AC
  Administered 2014-02-12: 5 mg via INTRAVENOUS
  Filled 2014-02-12 (×2): qty 5

## 2014-02-12 MED ORDER — SIMETHICONE 80 MG PO CHEW
80.0000 mg | CHEWABLE_TABLET | Freq: Four times a day (QID) | ORAL | Status: DC | PRN
  Administered 2014-02-12 – 2014-02-15 (×4): 80 mg via ORAL
  Filled 2014-02-12 (×7): qty 1

## 2014-02-12 NOTE — H&P (Signed)
Brenda [name] is an 43 y.o. female.    Pcp: Belinda Block Chief Complaint: lower abd pain HPI: 43 yo female with c/o bilateral lower abdominal pain starting yesterday.  Pt initially thought that it was due to constipation.  + fever.  Denies chills, cp, palp, sob, n/v, diarrhea, dysuria, hematuria.  Pt seen over at [facility] and transferred to [facility] for evaluation and found to have uti.  CT scan of the abd/pelvis=> cystitis.  Pt also noted to have sinus tachycardia.  Pt will be admitted for tachycardia, and uti.   Past Medical History  Diagnosis Date  . Endometrial polyp 2004    "polyps around the womb"  . Anemia   . Abnormal menstrual cycle     "been bleeding since 08/29/2013" (11/01/2013)  . History of blood transfusion 11/01/2013    related to abnormal menstrual cycle  . [id number](323.5)     "weekly" (11/01/2013)  . Heart murmur     Past Surgical History  Procedure Laterality Date  . Cesarean section  1994  . Dilation and curettage of uterus  2004    for endometrial polyp  . Tubal ligation  1994  . Robotic assisted total hysterectomy Bilateral 12/12/2013    Procedure: ROBOTIC ASSISTED TOTAL HYSTERECTOMY WITH BILATERAL SALPINGECTOMY;  Surgeon: Lavonia Drafts, MD;  Location: [facility];  Service: Gynecology;  Laterality: Bilateral;  . Cystoscopy N/A 12/12/2013    Procedure: CYSTOSCOPY;  Surgeon: Lavonia Drafts, MD;  Location: [facility];  Service: Gynecology;  Laterality: N/A;    Family History  Problem Relation Age of Onset  . Hypertension Maternal Grandmother    Social History:  reports that she has never smoked. She has never used smokeless tobacco. She reports that she does not drink alcohol or use illicit drugs.  Allergies: No Known Allergies   (Not in a hospital admission)  Results for orders placed or performed during the hospital encounter of 02/11/14 (from the past 48 hour(s))  Urinalysis, Routine w reflex microscopic     Status: Abnormal   Collection Time:  02/11/14  6:20 PM  Result Value Ref Range   Color, Urine YELLOW YELLOW   APPearance CLEAR CLEAR   Specific Gravity, Urine 1.020 1.005 - 1.030   pH 8.0 5.0 - 8.0   Glucose, UA NEGATIVE NEGATIVE mg/dL   Hgb urine dipstick SMALL (A) NEGATIVE   Bilirubin Urine NEGATIVE NEGATIVE   Ketones, ur 15 (A) NEGATIVE mg/dL   Protein, ur 30 (A) NEGATIVE mg/dL   Urobilinogen, UA 0.2 0.0 - 1.0 mg/dL   Nitrite NEGATIVE NEGATIVE   Leukocytes, UA SMALL (A) NEGATIVE  Urine microscopic-add on     Status: Abnormal   Collection Time: 02/11/14  6:20 PM  Result Value Ref Range   Squamous Epithelial / LPF MANY (A) RARE   WBC, UA 21-50 <3 WBC/hpf   RBC / HPF 3-6 <3 RBC/hpf   Bacteria, UA MANY (A) RARE  CBC     Status: Abnormal   Collection Time: 02/11/14  7:00 PM  Result Value Ref Range   WBC 16.3 (H) 4.0 - 10.5 K/uL   RBC 4.11 3.87 - 5.11 MIL/uL   Hemoglobin 11.4 (L) 12.0 - 15.0 g/dL   HCT 35.1 (L) 36.0 - 46.0 %   MCV 85.4 78.0 - 100.0 fL   MCH 27.7 26.0 - 34.0 pg   MCHC 32.5 30.0 - 36.0 g/dL   RDW 14.6 11.5 - 15.5 %   Platelets 253 150 - 400 K/uL  Comprehensive metabolic panel  Status: Abnormal   Collection Time: 02/11/14  7:00 PM  Result Value Ref Range   Sodium 134 (L) 137 - 147 mEq/L   Potassium 4.1 3.7 - 5.3 mEq/L   Chloride 95 (L) 96 - 112 mEq/L   CO2 26 19 - 32 mEq/L   Glucose, Bld 116 (H) 70 - 99 mg/dL   BUN 7 6 - 23 mg/dL   Creatinine, Ser 0.96 0.50 - 1.10 mg/dL   Calcium 8.8 8.4 - 10.5 mg/dL   Total Protein 7.6 6.0 - 8.3 g/dL   Albumin 3.6 3.5 - 5.2 g/dL   AST 16 0 - 37 U/L   ALT 13 0 - 35 U/L   Alkaline Phosphatase 91 39 - 117 U/L   Total Bilirubin 0.8 0.3 - 1.2 mg/dL   GFR calc non Af Amer 71 (L) >90 mL/min   GFR calc Af Amer 83 (L) >90 mL/min    Comment: (NOTE) The eGFR has been calculated using the CKD EPI equation. This calculation has not been validated in all clinical situations. eGFR's persistently <90 mL/min signify possible Chronic Kidney Disease.    Anion gap 13  5 - 15  Lipase, blood     Status: None   Collection Time: 02/12/14 12:09 AM  Result Value Ref Range   Lipase 14 11 - 59 U/L  I-Stat CG4 Lactic Acid, ED     Status: None   Collection Time: 02/12/14 12:19 AM  Result Value Ref Range   Lactic Acid, Venous 0.63 0.5 - 2.2 mmol/L   Ct Abdomen Pelvis W Contrast  02/11/2014   CLINICAL DATA:  Pt had abdominal hysterectomy on Sep 8th 2015 without any complications. Pt started yesterday with lower abd pain, took 1288m ibuprofen for pain yesterday with minimal relief. Pressure when she voids, noticed a strong odor with urine and a dark color. Fever.  EXAM: CT ABDOMEN AND PELVIS WITH CONTRAST  TECHNIQUE: Multidetector CT imaging of the abdomen and pelvis was performed using the standard protocol following bolus administration of intravenous contrast.  CONTRAST:  1077mOMNIPAQUE IOHEXOL 300 MG/ML  SOLN  COMPARISON:  None.  FINDINGS: Bladder is only mildly distended. The wall appears thickened with some hazy stranding in the perivesicular fat. Additional fat stranding is seen adjacent to the ovaries and adnexa. No pelvic fluid collection is seen to suggest an abscess. Uterus is surgically absent.  There are air-fluid levels within the small bowel and colon. No bowel wall thickening. No bowel dilation is seen to suggest obstruction. This suggests a mild diffuse adynamic ileus.  Lungs show mild basilar atelectasis.  Heart is normal in size.  Fatty infiltration of the liver.  No liver mass or focal lesion.  Spleen, gallbladder, pancreas, adrenal glands: Normal. Normal kidneys and ureters.  No pathologically enlarged lymph nodes.  No ascites.  Mild loss of disc height at L4-L5. Bony structures otherwise unremarkable.  IMPRESSION: 1. Bladder wall thickening with adjacent fat inflammation. Findings are consistent with cystitis. 2. No abnormal fluid collections. No evidence of a postoperative abscess. 3. Air-fluid levels within the colon and small bowel with no evidence of  bowel wall thickening or inflammation. Findings are consistent with a mild adynamic ileus. 4. Hepatic steatosis.   Electronically Signed   By: DaLajean Manes.D.   On: 02/11/2014 21:19   Dg Chest Portable 1 View  02/12/2014   CLINICAL DATA:  Shortness of breath/hypoxia.  EXAM: PORTABLE CHEST - 1 VIEW  COMPARISON:  11/02/2013 as well as CT abdomen/ pelvis  earlier today.  FINDINGS: Lungs are somewhat hypoinflated with minimal density in the right infrahilar region likely vascular crowding. No evidence of effusion or pneumothorax. Cardiomediastinal silhouette and remainder the exam is unchanged.  IMPRESSION: No active disease.   Electronically Signed   By: Marin Olp M.D.   On: 02/12/2014 00:47    Review of Systems  Constitutional: Positive for fever. Negative for chills, weight loss, malaise/fatigue and diaphoresis.  HENT: Negative for congestion, ear discharge, ear pain, hearing loss, nosebleeds, sore throat and tinnitus.   Eyes: Negative for blurred vision, double vision, photophobia, pain, discharge and redness.  Respiratory: Negative for cough, hemoptysis, sputum production, shortness of breath, wheezing and stridor.   Cardiovascular: Negative for chest pain, palpitations, orthopnea, claudication, leg swelling and PND.  Gastrointestinal: Negative for heartburn, nausea, vomiting, abdominal pain, diarrhea, constipation, blood in stool and melena.  Genitourinary: Negative for dysuria, urgency, frequency, hematuria and flank pain.  Musculoskeletal: Negative for myalgias, back pain, joint pain, falls and neck pain.  Skin: Negative for itching and rash.  Neurological: Negative for dizziness, tingling, tremors, sensory change, speech change, focal weakness, seizures, loss of consciousness, weakness and headaches.  Endo/Heme/Allergies: Negative for environmental allergies and polydipsia. Does not bruise/bleed easily.  Psychiatric/Behavioral: Negative for depression, suicidal ideas, hallucinations,  memory loss and substance abuse. The patient is not nervous/anxious and does not have insomnia.     Blood pressure 146/73, pulse 123, temperature 100.6 F (38.1 C), temperature source Oral, resp. rate 20, last menstrual period 12/01/2013, SpO2 92 %. Physical Exam  Constitutional: She is oriented to person, place, and time. She appears well-developed and well-nourished.  HENT:  Head: Normocephalic and atraumatic.  Eyes: Conjunctivae and EOM are normal. Pupils are equal, round, and reactive to light. Right eye exhibits no discharge. Left eye exhibits no discharge. No scleral icterus.  Neck: Normal range of motion. Neck supple.  Cardiovascular: Exam reveals no gallop and no friction rub.   Tachy s1, s2  Respiratory: Effort normal and breath sounds normal. No respiratory distress. She has no wheezes. She has no rales. She exhibits no tenderness.  GI: Soft. Bowel sounds are normal. She exhibits no distension. There is no tenderness. There is no rebound and no guarding.  Musculoskeletal: Normal range of motion. She exhibits no edema or tenderness.  Neurological: She is alert and oriented to person, place, and time. She has normal reflexes. She displays normal reflexes. No cranial nerve deficit. She exhibits normal muscle tone. Coordination normal.  Skin: Skin is warm and dry. No rash noted. No erythema. No pallor.  Psychiatric: She has a normal mood and affect. Her behavior is normal. Judgment and thought content normal.     Assessment/Plan Abdominal /suprapubic pain likely secondary to cystitis Rocephin 1gm iv qday  Tachycardia Cycle cardiac markers, check tsh, check d dimer, if + then CTA chest Consider checking cardiac echo  Hpyerglycemia:  Check hga1c  Anemia Repeat cbc in am  Leukocytosis Check cbc in am,  Likely secondary to uti  Brenda [name] 02/12/2014, 1:21 AM

## 2014-02-12 NOTE — Progress Notes (Signed)
Pt to have CTA with and without constrast.  Needs 20 IV in AC, attemped x 2 unsuccessful paged IV Team.

## 2014-02-12 NOTE — Progress Notes (Signed)
Pt hr 120' and 130's sustaining up to 140's while ambulating.  Unable to draw labs unable to vein.  Paged Dr Larinda Buttery, spoke with Dr. Tana Coast and she is ordering PICC line.

## 2014-02-12 NOTE — Plan of Care (Signed)
Problem: Phase I Progression Outcomes Goal: Pain controlled with appropriate interventions Outcome: Progressing     

## 2014-02-12 NOTE — Plan of Care (Signed)
Problem: Phase III Progression Outcomes Goal: Pain controlled on oral analgesia Outcome: Completed/Met Date Met:  02/12/14 PO analgesic is effective.

## 2014-02-12 NOTE — Progress Notes (Signed)
Patient ID: Brenda [name]  female  PJK:[id number]    DOB: Apr 16, 1970    DOA: 02/11/2014  PCP: Mora Bellman, MD  Brief history of present illness  Patient is a 43 year old female with history of anemia, presented with bilateral lower abdominal pain that started yesterday. Patient initially thought it was due to constipation but was having fevers. She took Dulcolax, mag citrate and eventually had loose bowel movement. Also endorsed having nausea and vomiting.  CT scan of the abdomen and pelvis showed cystitis, patient was also noted to have sinus tachycardia. Patient was admitted for further workup.  Assessment/Plan: Principal Problem:   UTI (lower urinary tract infection)with acute cystitis - Continue IV Rocephin, IV fluids, follow blood cultures, urine culture and sensitivity  Active Problems:   Tachycardia - Likely due to #1,lactic acid normal, troponin less than 0.3, d-dimer 2.97, TSH 3.4 - No prior history of PE, CT angiogram chest to rule out PE  Abdominal pain with Diarrhea: Possibly due to stool softeners, C. Difficile pending - CT scan abdomen and pelvis showed mild adynamic ileus, abdominal ultrasound negative for acute cholecystitis  DVT Prophylaxis:SCDs  Code Status:  Family Communication:discussed with patient and the family member at the bedside  Disposition:  Consultants:  none  Procedures:  none  Antibiotics:  IV Rocephin    Subjective: Patient seen and examined, feels better, still somewhat tachycardiac no fevers, abdominal pain improving  Objective: Weight change:   Intake/Output Summary (Last 24 hours) at 02/12/14 1121 Last data filed at 02/12/14 0900  Gross per 24 hour  Intake   32.5 ml  Output      0 ml  Net   32.5 ml   Blood pressure 147/89, pulse 105, temperature 98.3 F (36.8 C), temperature source Oral, resp. rate 18, last menstrual period 12/01/2013, SpO2 98 %.  Physical Exam: General: Alert and awake, oriented x3, not in any  acute distress. CVS: S1-S2 clear, no murmur rubs or gallops Chest: clear to auscultation bilaterally, no wheezing, rales or rhonchi Abdomen: obeses, mild diffuse tenderness, normal bowel sounds Extremities: no cyanosis, clubbing or edema noted bilaterally Neuro: Cranial nerves II-XII intact, no focal neurological deficits  Lab Results: Basic Metabolic Panel:  Recent Labs Lab 02/11/14 1900 02/12/14 0615  NA 134* 136*  K 4.1 3.6*  CL 95* 100  CO2 26 23  GLUCOSE 116* 133*  BUN 7 6  CREATININE 0.96 0.89  CALCIUM 8.8 8.1*   Liver Function Tests:  Recent Labs Lab 02/11/14 1900 02/12/14 0615  AST 16 12  ALT 13 10  ALKPHOS 91 84  BILITOT 0.8 0.6  PROT 7.6 7.3  ALBUMIN 3.6 3.0*    Recent Labs Lab 02/12/14 0009  LIPASE 14   No results for input(s): AMMONIA in the last 168 hours. CBC:  Recent Labs Lab 02/11/14 1900 02/12/14 0615  WBC 16.3* 12.2*  NEUTROABS  --  10.0*  HGB 11.4* 10.4*  HCT 35.1* 33.4*  MCV 85.4 86.3  PLT 253 207   Cardiac Enzymes:  Recent Labs Lab 02/12/14 0615  TROPONINI <0.30   BNP: Invalid input(s): POCBNP CBG: No results for input(s): GLUCAP in the last 168 hours.   Micro Results: No results found for this or any previous visit (from the past 240 hour(s)).  Studies/Results: US Abdomen Complete  02/12/2014   CLINICAL DATA:  Abdominal pain.  EXAM: ULTRASOUND ABDOMEN COMPLETE  COMPARISON:  CT abdomen and pelvis 02/11/2014.  FINDINGS: Gallbladder: A very small amount of sludge is seen in the gallbladder  but there is no gallbladder wall thickening or pericholecystic fluid. Sonographer reports negative Murphy's sign.  Common bile duct: Diameter: 0.4 cm.  Liver: The liver demonstrates increased echogenicity and somewhat coarsened echotexture. No focal lesion or intrahepatic biliary ductal dilatation is identified.  IVC: No abnormality visualized.  Pancreas: Visualized portion unremarkable.  Spleen: Size and appearance within normal limits.   Right Kidney: Length: 12.4 cm. Echogenicity within normal limits. No mass or hydronephrosis visualized.  Left Kidney: Length: 11.1 cm. Echogenicity within normal limits. No mass or hydronephrosis visualized.  Abdominal aorta: No aneurysm visualized.  Other findings: None.  IMPRESSION: Small amount of gallbladder sludge. Negative for gallstones or evidence of acute cholecystitis.  Fatty infiltration of the liver.   Electronically Signed   By: Inge Rise M.D.   On: 02/12/2014 01:34   Ct Abdomen Pelvis W Contrast  02/11/2014   CLINICAL DATA:  Pt had abdominal hysterectomy on Sep 8th 2015 without any complications. Pt started yesterday with lower abd pain, took 1200mg  ibuprofen for pain yesterday with minimal relief. Pressure when she voids, noticed a strong odor with urine and a dark color. Fever.  EXAM: CT ABDOMEN AND PELVIS WITH CONTRAST  TECHNIQUE: Multidetector CT imaging of the abdomen and pelvis was performed using the standard protocol following bolus administration of intravenous contrast.  CONTRAST:  172mL OMNIPAQUE IOHEXOL 300 MG/ML  SOLN  COMPARISON:  None.  FINDINGS: Bladder is only mildly distended. The wall appears thickened with some hazy stranding in the perivesicular fat. Additional fat stranding is seen adjacent to the ovaries and adnexa. No pelvic fluid collection is seen to suggest an abscess. Uterus is surgically absent.  There are air-fluid levels within the small bowel and colon. No bowel wall thickening. No bowel dilation is seen to suggest obstruction. This suggests a mild diffuse adynamic ileus.  Lungs show mild basilar atelectasis.  Heart is normal in size.  Fatty infiltration of the liver.  No liver mass or focal lesion.  Spleen, gallbladder, pancreas, adrenal glands: Normal. Normal kidneys and ureters.  No pathologically enlarged lymph nodes.  No ascites.  Mild loss of disc height at L4-L5. Bony structures otherwise unremarkable.  IMPRESSION: 1. Bladder wall thickening with  adjacent fat inflammation. Findings are consistent with cystitis. 2. No abnormal fluid collections. No evidence of a postoperative abscess. 3. Air-fluid levels within the colon and small bowel with no evidence of bowel wall thickening or inflammation. Findings are consistent with a mild adynamic ileus. 4. Hepatic steatosis.   Electronically Signed   By: Lajean Manes M.D.   On: 02/11/2014 21:19   Dg Chest Portable 1 View  02/12/2014   CLINICAL DATA:  Shortness of breath/hypoxia.  EXAM: PORTABLE CHEST - 1 VIEW  COMPARISON:  11/02/2013 as well as CT abdomen/ pelvis earlier today.  FINDINGS: Lungs are somewhat hypoinflated with minimal density in the right infrahilar region likely vascular crowding. No evidence of effusion or pneumothorax. Cardiomediastinal silhouette and remainder the exam is unchanged.  IMPRESSION: No active disease.   Electronically Signed   By: Marin Olp M.D.   On: 02/12/2014 00:47    Medications: Scheduled Meds: . cefTRIAXone (ROCEPHIN)  IV  1 g Intravenous Q24H  . enoxaparin (LOVENOX) injection  40 mg Subcutaneous Q24H  . ferrous fumarate  1 tablet Oral TID  . sodium chloride  3 mL Intravenous Q12H      LOS: 1 day   Meghanne Pletz M.D. [facility] 02/12/2014, 11:21 AM Pager: [phone]  If 7PM-7AM, please contact night-coverage [email or web address]  Password [email or web address]

## 2014-02-12 NOTE — ED Provider Notes (Signed)
CSN: [id number]     Arrival date & time 02/11/14  1742 History   First MD Initiated Contact with Patient 02/11/14 2339     Chief Complaint  Patient presents with  . Abdominal Pain     (Consider location/radiation/quality/duration/timing/severity/associated sxs/prior Treatment) HPI Brenda [name] is a 43 y.o. femaleWho presents to emergency department complaining of abdominal pain, fever, nausea, vomiting. Patient states 2 days ago she developed some constipation, states yesterday symptoms worsened and she developed upper abdominal pain. She states she took Dulcolax, magnesium citrate, and states she eventually had loose bowel movement with chunks of stool in it. States since then her abdominal pain has worsened. states her pain is mainly inepigastric areaand right upper quadrant. States it does not radiate. No pain in her back. Nausea vomiting did not start until she came to [facility] today. She reports hysterectomy on 12/12/2013.patient was evaluated [facility], was found to have cystitis, treated with Rocephin and IV fluids. She was also found to be febrile 102, with heart rate of 130s. Patient was transferred here for further evaluation and treatment of her symptoms.  Past Medical History  Diagnosis Date  . Endometrial polyp 2004    "polyps around the womb"  . Anemia   . Abnormal menstrual cycle     "been bleeding since 08/29/2013" (11/01/2013)  . History of blood transfusion 11/01/2013    related to abnormal menstrual cycle  . DDUKGURK(270.6)     "weekly" (11/01/2013)  . Heart murmur    Past Surgical History  Procedure Laterality Date  . Cesarean section  1994  . Dilation and curettage of uterus  2004    for endometrial polyp  . Tubal ligation  1994  . Robotic assisted total hysterectomy Bilateral 12/12/2013    Procedure: ROBOTIC ASSISTED TOTAL HYSTERECTOMY WITH BILATERAL SALPINGECTOMY;  Surgeon: Lavonia Drafts, MD;  Location: [facility];  Service: Gynecology;   Laterality: Bilateral;  . Cystoscopy N/A 12/12/2013    Procedure: CYSTOSCOPY;  Surgeon: Lavonia Drafts, MD;  Location: [facility];  Service: Gynecology;  Laterality: N/A;   Family History  Problem Relation Age of Onset  . Hypertension Maternal Grandmother    History  Substance Use Topics  . Smoking status: Never Smoker   . Smokeless tobacco: Never Used  . Alcohol Use: No   OB History    Gravida Para Term Preterm AB TAB SAB Ectopic Multiple Living   2 2 2      1 3      Review of Systems  Constitutional: Positive for fever and chills.  HENT: Negative.   Respiratory: Negative for cough, chest tightness and shortness of breath.   Cardiovascular: Negative for chest pain, palpitations and leg swelling.  Gastrointestinal: Positive for nausea, vomiting, abdominal pain, diarrhea and constipation. Negative for blood in stool.  Genitourinary: Positive for dysuria, urgency and frequency. Negative for flank pain, vaginal bleeding, vaginal discharge, vaginal pain and pelvic pain.  Musculoskeletal: Negative for myalgias, arthralgias, neck pain and neck stiffness.  Skin: Negative for rash.  Neurological: Negative for dizziness, weakness and headaches.  All other systems reviewed and are negative.     Allergies  Review of patient's allergies indicates no known allergies.  Home Medications   Prior to Admission medications   Medication Sig Start Date End Date Taking? Authorizing Provider  docusate sodium (COLACE) 100 MG capsule Take 100 mg by mouth 2 (two) times daily as needed for mild constipation.    Yes Historical Provider, MD  ferrous fumarate (HEMOCYTE - 106 MG  FE) 325 (106 FE) MG TABS tablet Take 1 tablet by mouth 3 (three) times daily.   Yes Historical Provider, MD  hydrochlorothiazide (MICROZIDE) 12.5 MG capsule Take 12.5 mg by mouth daily.   Yes Historical Provider, MD  ibuprofen (ADVIL,MOTRIN) 600 MG tablet Take 1 tablet (600 mg total) by mouth every 6 (six) hours as needed.  12/12/13  Yes Lavonia Drafts, MD  oxyCODONE-acetaminophen (PERCOCET/ROXICET) 5-325 MG per tablet Take 1-2 tablets by mouth every 6 (six) hours as needed. 12/12/13  Yes Lavonia Drafts, MD  simethicone (MYLICON) 644 MG chewable tablet Chew 125 mg by mouth every 6 (six) hours as needed for flatulence.   Yes Historical Provider, MD   BP 136/66 mmHg  Pulse 131  Temp(Src) 100.6 F (38.1 C) (Oral)  Resp 25  SpO2 93%  LMP 12/01/2013 (Approximate) Physical Exam  Constitutional: She is oriented to person, place, and time. She appears well-developed and well-nourished. No distress.  HENT:  Head: Normocephalic.  Eyes: Conjunctivae are normal.  Neck: Normal range of motion. Neck supple.  Cardiovascular: Regular rhythm and normal heart sounds.   tachycardia  Pulmonary/Chest: Effort normal and breath sounds normal. No respiratory distress. She has no wheezes. She has no rales.  Abdominal: Soft. Bowel sounds are normal. She exhibits no distension. There is tenderness. There is guarding. There is no rebound.  Right upper quadrant and epigastric tenderness  Musculoskeletal: She exhibits no edema.  Neurological: She is alert and oriented to person, place, and time.  Skin: Skin is warm and dry.  Psychiatric: She has a normal mood and affect. Her behavior is normal.  Nursing note and vitals reviewed.   ED Course  Procedures (including critical care time) Labs Review Labs Reviewed  CBC - Abnormal; Notable for the following:    WBC 16.3 (*)    Hemoglobin 11.4 (*)    HCT 35.1 (*)    All other components within normal limits  COMPREHENSIVE METABOLIC PANEL - Abnormal; Notable for the following:    Sodium 134 (*)    Chloride 95 (*)    Glucose, Bld 116 (*)    GFR calc non Af Amer 71 (*)    GFR calc Af Amer 83 (*)    All other components within normal limits  URINALYSIS, ROUTINE W REFLEX MICROSCOPIC - Abnormal; Notable for the following:    Hgb urine dipstick SMALL (*)    Ketones, ur 15  (*)    Protein, ur 30 (*)    Leukocytes, UA SMALL (*)    All other components within normal limits  URINE MICROSCOPIC-ADD ON - Abnormal; Notable for the following:    Squamous Epithelial / LPF MANY (*)    Bacteria, UA MANY (*)    All other components within normal limits  URINE CULTURE  LIPASE, BLOOD  I-STAT CG4 LACTIC ACID, ED    Imaging Review Ct Abdomen Pelvis W Contrast  02/11/2014   CLINICAL DATA:  Pt had abdominal hysterectomy on Sep 8th 2015 without any complications. Pt started yesterday with lower abd pain, took 1200mg  ibuprofen for pain yesterday with minimal relief. Pressure when she voids, noticed a strong odor with urine and a dark color. Fever.  EXAM: CT ABDOMEN AND PELVIS WITH CONTRAST  TECHNIQUE: Multidetector CT imaging of the abdomen and pelvis was performed using the standard protocol following bolus administration of intravenous contrast.  CONTRAST:  187mL OMNIPAQUE IOHEXOL 300 MG/ML  SOLN  COMPARISON:  None.  FINDINGS: Bladder is only mildly distended. The wall appears thickened with  some hazy stranding in the perivesicular fat. Additional fat stranding is seen adjacent to the ovaries and adnexa. No pelvic fluid collection is seen to suggest an abscess. Uterus is surgically absent.  There are air-fluid levels within the small bowel and colon. No bowel wall thickening. No bowel dilation is seen to suggest obstruction. This suggests a mild diffuse adynamic ileus.  Lungs show mild basilar atelectasis.  Heart is normal in size.  Fatty infiltration of the liver.  No liver mass or focal lesion.  Spleen, gallbladder, pancreas, adrenal glands: Normal. Normal kidneys and ureters.  No pathologically enlarged lymph nodes.  No ascites.  Mild loss of disc height at L4-L5. Bony structures otherwise unremarkable.  IMPRESSION: 1. Bladder wall thickening with adjacent fat inflammation. Findings are consistent with cystitis. 2. No abnormal fluid collections. No evidence of a postoperative abscess.  3. Air-fluid levels within the colon and small bowel with no evidence of bowel wall thickening or inflammation. Findings are consistent with a mild adynamic ileus. 4. Hepatic steatosis.   Electronically Signed   By: Lajean Manes M.D.   On: 02/11/2014 21:19     EKG Interpretation None      MDM   Final diagnoses:  Fever, unspecified fever cause  Abdominal pain   Patient with epigastric pain, nausea, vomiting, fever and [facility] 102.8. She received Toradol and Tylenol for her fever.she received Rocephin 1 g also at [facility]. She was given Dilaudid and Phenergan for her symptoms. CT of the abdomen and pelvis showed cystitis as well as most likely ileus and steatohepatitis. Patient was transferred here for continued tachycardia and uncontrolled symptoms. On my assessment, patient states her pain is 5 out of 10, pain is mainly epigastric. Will add lipase to her labs, lactic acid, ultrasound abdomen. ECG obtained for tachycardia. More IV fluids ordered.   12:59 AM Pt continues to be tachycardic. ECG showing sinus tachy. CXR negative. Lactic acid normal. Lipase normal. SPoke with [facility], will admit, but asked to ambulate pt with pulse ox to see if she desats. Concern for possible PE. Pt denies any SOB or CP to me. Pt currently in [place], will ambulate once she is back.    1:45 AM Patient ambulated with oxygen monitor, oxygen stayed above 93%. Patient was seen by try at hospitalist, who will be admitted.  Filed Vitals:   02/12/14 0143  BP: 150/79  Pulse: 116  Temp: 99.3 F (37.4 C)  Resp: [address]     Katy Brickell A March Joos, PA-C 02/12/14 [place], MD 02/12/14 0245

## 2014-02-12 NOTE — Progress Notes (Signed)
Pt c/o heartburn. Notified on call NP. New orders received.

## 2014-02-12 NOTE — Progress Notes (Signed)
Pt given lopressor 5 mg IV for HR 140's after lopressor HR 112.

## 2014-02-12 NOTE — Progress Notes (Signed)
Pt alert and responsive. HR 130s. Notified on call, NP. New orders received. Lopresser 5mg  IV administered. After Lopressor HR now 120s.

## 2014-02-13 DIAGNOSIS — R000 Tachycardia, unspecified: Secondary | ICD-10-CM

## 2014-02-13 LAB — BASIC METABOLIC PANEL
Anion gap: 16 — ABNORMAL HIGH (ref 5–15)
BUN: 5 mg/dL — AB (ref 6–23)
CALCIUM: 8 mg/dL — AB (ref 8.4–10.5)
CHLORIDE: 96 meq/L (ref 96–112)
CO2: 22 meq/L (ref 19–32)
CREATININE: 0.99 mg/dL (ref 0.50–1.10)
GFR calc Af Amer: 80 mL/min — ABNORMAL LOW (ref >90)
GFR calc non Af Amer: 69 mL/min — ABNORMAL LOW (ref >90)
Glucose, Bld: 125 mg/dL — ABNORMAL HIGH (ref 70–99)
Potassium: 3.8 mEq/L (ref 3.7–5.3)
Sodium: 134 mEq/L — ABNORMAL LOW (ref 137–147)

## 2014-02-13 LAB — CBC
HEMATOCRIT: 30.4 % — AB (ref 36.0–46.0)
Hemoglobin: 9.6 g/dL — ABNORMAL LOW (ref 12.0–15.0)
MCH: 27 pg (ref 26.0–34.0)
MCHC: 31.6 g/dL (ref 30.0–36.0)
MCV: 85.6 fL (ref 78.0–100.0)
Platelets: 203 10*3/uL (ref 150–400)
RBC: 3.55 MIL/uL — ABNORMAL LOW (ref 3.87–5.11)
RDW: 14.5 % (ref 11.5–15.5)
WBC: 12.1 10*3/uL — ABNORMAL HIGH (ref 4.0–10.5)

## 2014-02-13 LAB — CLOSTRIDIUM DIFFICILE BY PCR: Toxigenic C. Difficile by PCR: NEGATIVE

## 2014-02-13 LAB — URINE CULTURE
Colony Count: 25000
Special Requests: NORMAL

## 2014-02-13 MED ORDER — METOPROLOL TARTRATE 12.5 MG HALF TABLET
12.5000 mg | ORAL_TABLET | Freq: Two times a day (BID) | ORAL | Status: DC
  Administered 2014-02-13 – 2014-02-15 (×5): 12.5 mg via ORAL
  Filled 2014-02-13 (×7): qty 1

## 2014-02-13 MED ORDER — PANTOPRAZOLE SODIUM 40 MG PO TBEC
40.0000 mg | DELAYED_RELEASE_TABLET | Freq: Every day | ORAL | Status: DC
  Administered 2014-02-13 – 2014-02-17 (×5): 40 mg via ORAL
  Filled 2014-02-13 (×5): qty 1

## 2014-02-13 MED ORDER — IMIPENEM-CILASTATIN 500 MG IV SOLR
500.0000 mg | Freq: Four times a day (QID) | INTRAVENOUS | Status: DC
  Administered 2014-02-13 – 2014-02-14 (×5): 500 mg via INTRAVENOUS
  Filled 2014-02-13 (×9): qty 500

## 2014-02-13 MED ORDER — POLYETHYLENE GLYCOL 3350 17 G PO PACK
17.0000 g | PACK | Freq: Once | ORAL | Status: AC
  Administered 2014-02-13: 17 g via ORAL
  Filled 2014-02-13: qty 1

## 2014-02-13 MED ORDER — DOCUSATE SODIUM 100 MG PO CAPS
100.0000 mg | ORAL_CAPSULE | Freq: Two times a day (BID) | ORAL | Status: DC
  Administered 2014-02-13 – 2014-02-17 (×9): 100 mg via ORAL
  Filled 2014-02-13 (×12): qty 1

## 2014-02-13 NOTE — Plan of Care (Signed)
Problem: Consults Goal: Skin Care Protocol Initiated - if Braden Score 18 or less If consults are not indicated, leave blank or document N/A  Outcome: Not Applicable Date Met:  88/33/74 Goal: Nutrition Consult-if indicated Outcome: Not Applicable Date Met:  45/14/60 Goal: Diabetes Guidelines if Diabetic/Glucose > 140 If diabetic or lab glucose is > 140 mg/dl - Initiate Diabetes/Hyperglycemia Guidelines & Document Interventions  Outcome: Not Applicable Date Met:  47/99/87  Problem: Phase I Progression Outcomes Goal: Voiding-avoid urinary catheter unless indicated Outcome: Completed/Met Date Met:  02/13/14

## 2014-02-13 NOTE — Progress Notes (Signed)
Utilization review completed.  

## 2014-02-13 NOTE — Progress Notes (Signed)
Patient ID: Coda Filler  female  KGM:[id number]    DOB: 1970/11/01    DOA: 02/11/2014  PCP: Mora Bellman, MD  Brief history of present illness  Patient is a 42 year old female with history of anemia, presented with bilateral lower abdominal pain that started yesterday. Patient initially thought it was due to constipation but was having fevers. She took Dulcolax, mag citrate and eventually had loose bowel movement. Also endorsed having nausea and vomiting.  CT scan of the abdomen and pelvis showed cystitis, patient was also noted to have sinus tachycardia. Patient was admitted for further workup.  Assessment/Plan: Principal Problem:   UTI (lower urinary tract infection)with acute cystitis: continues to spike fevers overnight, T max 103 - repeat urine culture, blood cultures negative so far, changed antibiotic to Primaxin  Active Problems:   Tachycardia - Likely due to #1,lactic acid normal, troponin less than 0.3, d-dimer 2.97, TSH 3.4 - No prior history of PE, CT angiogram negative for pulmonary embolism  Abdominal pain with Diarrhea at the time of admission: - no diarrhea, patient now complaining of constipation, placed on stool softeners Possibly due to stool softeners  Hypertension with tachycardia - Placed on scheduled Lopressor  DVT Prophylaxis:SCDs  Code Status:  Family Communication:discussed with patient and the family member at the bedside  Disposition:  Consultants:  none  Procedures:  none  Antibiotics:  IV Rocephin    Subjective: Patient seen and examined, overall feeling better however still spiking fevers overnight, Tmax 103  Objective: Weight change:   Intake/Output Summary (Last 24 hours) at 02/13/14 1159 Last data filed at 02/13/14 0925  Gross per 24 hour  Intake    480 ml  Output      0 ml  Net    480 ml   Blood pressure 165/96, pulse 105, temperature 98.2 F (36.8 C), temperature source Oral, resp. rate 19, weight 109.907 kg (242  lb 4.8 oz), last menstrual period 12/01/2013, SpO2 98 %.  Physical Exam: General: Alert and awake, oriented x3, not in any acute distress. CVS: S1-S2 clear, no murmur rubs or gallops Chest: clear to auscultation bilaterally, no wheezing, rales or rhonchi Abdomen: obeses, mild diffuse tenderness, normal bowel sounds Extremities: no cyanosis, clubbing or edema noted bilaterally   Lab Results: Basic Metabolic Panel:  Recent Labs Lab 02/12/14 0615 02/13/14 0511  NA 136* 134*  K 3.6* 3.8  CL 100 96  CO2 23 22  GLUCOSE 133* 125*  BUN 6 5*  CREATININE 0.89 0.99  CALCIUM 8.1* 8.0*   Liver Function Tests:  Recent Labs Lab 02/11/14 1900 02/12/14 0615  AST 16 12  ALT 13 10  ALKPHOS 91 84  BILITOT 0.8 0.6  PROT 7.6 7.3  ALBUMIN 3.6 3.0*    Recent Labs Lab 02/12/14 0009  LIPASE 14   No results for input(s): AMMONIA in the last 168 hours. CBC:  Recent Labs Lab 02/12/14 0615 02/13/14 0511  WBC 12.2* 12.1*  NEUTROABS 10.0*  --   HGB 10.4* 9.6*  HCT 33.4* 30.4*  MCV 86.3 85.6  PLT 207 203   Cardiac Enzymes:  Recent Labs Lab 02/12/14 0615 02/12/14 1211 02/12/14 1841  TROPONINI <0.30 <0.30 <0.30   BNP: Invalid input(s): POCBNP CBG: No results for input(s): GLUCAP in the last 168 hours.   Micro Results: Recent Results (from the past 240 hour(s))  Urine culture     Status: None   Collection Time: 02/11/14  6:20 PM  Result Value Ref Range Status   Specimen Description  URINE, CLEAN CATCH  Final   Special Requests none Normal  Final   Culture  Setup Time   Final    02/12/2014 01:25 Performed at [facility]   Final    25,000 COLONIES/ML Performed at [facility]    Culture   Final    Multiple bacterial morphotypes present, none predominant. Suggest appropriate recollection if clinically indicated. Performed at [facility]    Report Status 02/13/2014 FINAL  Final  Culture, blood (routine x 2)     Status:  None (Preliminary result)   Collection Time: 02/12/14  2:02 PM  Result Value Ref Range Status   Specimen Description BLOOD LEFT HAND  Final   Special Requests BOTTLES DRAWN AEROBIC ONLY 5CC  Final   Culture  Setup Time   Final    02/12/2014 19:46 Performed at [facility]    Culture   Final           BLOOD CULTURE RECEIVED NO GROWTH TO DATE CULTURE WILL BE HELD FOR 5 DAYS BEFORE ISSUING A FINAL NEGATIVE REPORT Performed at [facility]    Report Status PENDING  Incomplete  Culture, blood (routine x 2)     Status: None (Preliminary result)   Collection Time: 02/12/14  2:22 PM  Result Value Ref Range Status   Specimen Description BLOOD LEFT HAND  Final   Special Requests BOTTLES DRAWN AEROBIC ONLY 5CC  Final   Culture  Setup Time   Final    02/12/2014 19:46 Performed at [facility]    Culture   Final           BLOOD CULTURE RECEIVED NO GROWTH TO DATE CULTURE WILL BE HELD FOR 5 DAYS BEFORE ISSUING A FINAL NEGATIVE REPORT Performed at [facility]    Report Status PENDING  Incomplete    Studies/Results: Ct Angio Chest Pe W/cm &/or Wo Cm  02/12/2014   CLINICAL DATA:  Shortness of breath. Trouble with heart racing since February 11, 2014.  EXAM: CT ANGIOGRAPHY CHEST WITH CONTRAST  TECHNIQUE: Multidetector CT imaging of the chest was performed using the standard protocol during bolus administration of intravenous contrast. Multiplanar CT image reconstructions and MIPs were obtained to evaluate the vascular anatomy.  CONTRAST:  26mL OMNIPAQUE IOHEXOL 350 MG/ML SOLN  COMPARISON:  Chest x-ray February 12, 2014  FINDINGS: There is no pulmonary embolus. The aorta is normal without aneurysmal dilatation or dissection. There is no mediastinal or hilar lymphadenopathy. The heart size is upper limits of normal. There is no pericardial effusion. Images of the lungs demonstrate dependent atelectasis of the posterior lung bases, right greater than left. There is no focal  pneumonia or pleural effusion. The visualized upper abdominal structures are unremarkable. There is a small hiatal hernia.  Review of the MIP images confirms the above findings.  IMPRESSION: No pulmonary embolus. Mild dependent atelectasis of the posterior lung bases, right greater than left.   Electronically Signed   By: Abelardo Diesel M.D.   On: 02/12/2014 11:45   US Abdomen Complete  02/12/2014   CLINICAL DATA:  Abdominal pain.  EXAM: ULTRASOUND ABDOMEN COMPLETE  COMPARISON:  CT abdomen and pelvis 02/11/2014.  FINDINGS: Gallbladder: A very small amount of sludge is seen in the gallbladder but there is no gallbladder wall thickening or pericholecystic fluid. Sonographer reports negative Murphy's sign.  Common bile duct: Diameter: 0.4 cm.  Liver: The liver demonstrates increased echogenicity and somewhat coarsened echotexture. No  focal lesion or intrahepatic biliary ductal dilatation is identified.  IVC: No abnormality visualized.  Pancreas: Visualized portion unremarkable.  Spleen: Size and appearance within normal limits.  Right Kidney: Length: 12.4 cm. Echogenicity within normal limits. No mass or hydronephrosis visualized.  Left Kidney: Length: 11.1 cm. Echogenicity within normal limits. No mass or hydronephrosis visualized.  Abdominal aorta: No aneurysm visualized.  Other findings: None.  IMPRESSION: Small amount of gallbladder sludge. Negative for gallstones or evidence of acute cholecystitis.  Fatty infiltration of the liver.   Electronically Signed   By: Inge Rise M.D.   On: 02/12/2014 01:34   Ct Abdomen Pelvis W Contrast  02/11/2014   CLINICAL DATA:  Pt had abdominal hysterectomy on Sep 8th 2015 without any complications. Pt started yesterday with lower abd pain, took 1200mg  ibuprofen for pain yesterday with minimal relief. Pressure when she voids, noticed a strong odor with urine and a dark color. Fever.  EXAM: CT ABDOMEN AND PELVIS WITH CONTRAST  TECHNIQUE: Multidetector CT imaging of the  abdomen and pelvis was performed using the standard protocol following bolus administration of intravenous contrast.  CONTRAST:  143mL OMNIPAQUE IOHEXOL 300 MG/ML  SOLN  COMPARISON:  None.  FINDINGS: Bladder is only mildly distended. The wall appears thickened with some hazy stranding in the perivesicular fat. Additional fat stranding is seen adjacent to the ovaries and adnexa. No pelvic fluid collection is seen to suggest an abscess. Uterus is surgically absent.  There are air-fluid levels within the small bowel and colon. No bowel wall thickening. No bowel dilation is seen to suggest obstruction. This suggests a mild diffuse adynamic ileus.  Lungs show mild basilar atelectasis.  Heart is normal in size.  Fatty infiltration of the liver.  No liver mass or focal lesion.  Spleen, gallbladder, pancreas, adrenal glands: Normal. Normal kidneys and ureters.  No pathologically enlarged lymph nodes.  No ascites.  Mild loss of disc height at L4-L5. Bony structures otherwise unremarkable.  IMPRESSION: 1. Bladder wall thickening with adjacent fat inflammation. Findings are consistent with cystitis. 2. No abnormal fluid collections. No evidence of a postoperative abscess. 3. Air-fluid levels within the colon and small bowel with no evidence of bowel wall thickening or inflammation. Findings are consistent with a mild adynamic ileus. 4. Hepatic steatosis.   Electronically Signed   By: Lajean Manes M.D.   On: 02/11/2014 21:19   Dg Chest Portable 1 View  02/12/2014   CLINICAL DATA:  Shortness of breath/hypoxia.  EXAM: PORTABLE CHEST - 1 VIEW  COMPARISON:  11/02/2013 as well as CT abdomen/ pelvis earlier today.  FINDINGS: Lungs are somewhat hypoinflated with minimal density in the right infrahilar region likely vascular crowding. No evidence of effusion or pneumothorax. Cardiomediastinal silhouette and remainder the exam is unchanged.  IMPRESSION: No active disease.   Electronically Signed   By: Marin Olp M.D.   On:  02/12/2014 00:47    Medications: Scheduled Meds: . docusate sodium  100 mg Oral BID  . enoxaparin (LOVENOX) injection  40 mg Subcutaneous Q24H  . ferrous fumarate  1 tablet Oral TID  . imipenem-cilastatin  500 mg Intravenous 4 times per day  . metoprolol tartrate  12.5 mg Oral BID  . pantoprazole  40 mg Oral Q0600  . sodium chloride  3 mL Intravenous Q12H      LOS: 2 days   RAI,RIPUDEEP M.D. [facility] 02/13/2014, 11:59 AM Pager: [phone]  If 7PM-7AM, please contact night-coverage [email or web address] Password [email or web address]

## 2014-02-13 NOTE — Progress Notes (Signed)
ANTIBIOTIC CONSULT NOTE - INITIAL  Pharmacy Consult for imipenem Indication: sepsis/uti  No Known Allergies  Patient Measurements: Weight: 242 lb 4.8 oz (109.907 kg) Adjusted Body Weight:   Vital Signs: Temp: 103 F (39.4 C) (11/10 0528) Temp Source: Oral (11/10 0528) BP: 169/96 mmHg (11/10 0528) Pulse Rate: 128 (11/10 0528) Intake/Output from previous day: 11/09 0701 - 11/10 0700 In: 240 [P.O.:240] Out: -  Intake/Output from this shift:    Labs:  Recent Labs  02/11/14 1900 02/12/14 0615 02/13/14 0511  WBC 16.3* 12.2* 12.1*  HGB 11.4* 10.4* 9.6*  PLT 253 207 203  CREATININE 0.96 0.89 0.99   Estimated Creatinine Clearance: 93.6 mL/min (by C-G formula based on Cr of 0.99). No results for input(s): VANCOTROUGH, VANCOPEAK, VANCORANDOM, GENTTROUGH, GENTPEAK, GENTRANDOM, TOBRATROUGH, TOBRAPEAK, TOBRARND, AMIKACINPEAK, AMIKACINTROU, AMIKACIN in the last 72 hours.   Microbiology: Recent Results (from the past 720 hour(s))  Urine culture     Status: None   Collection Time: 02/11/14  6:20 PM  Result Value Ref Range Status   Specimen Description URINE, CLEAN CATCH  Final   Special Requests none Normal  Final   Culture  Setup Time   Final    02/12/2014 01:25 Performed at [place]   Final    25,000 COLONIES/ML Performed at [facility]    Culture   Final    Multiple bacterial morphotypes present, none predominant. Suggest appropriate recollection if clinically indicated. Performed at [facility]    Report Status 02/13/2014 FINAL  Final    Medical History: Past Medical History  Diagnosis Date  . Endometrial polyp 2004    "polyps around the womb"  . Anemia   . Abnormal menstrual cycle     "been bleeding since 08/29/2013" (11/01/2013)  . History of blood transfusion 11/01/2013    related to abnormal menstrual cycle  . ASTMHDQQ(229.7)     "weekly" (11/01/2013)  . Heart murmur   . Hypertension     Medications:   Prescriptions prior to admission  Medication Sig Dispense Refill Last Dose  . docusate sodium (COLACE) 100 MG capsule Take 100 mg by mouth 2 (two) times daily as needed for mild constipation.    02/10/2014 at Unknown time  . ferrous fumarate (HEMOCYTE - 106 MG FE) 325 (106 FE) MG TABS tablet Take 1 tablet by mouth 3 (three) times daily.   Past Month at Unknown time  . hydrochlorothiazide (MICROZIDE) 12.5 MG capsule Take 12.5 mg by mouth daily.   unknown at unknown  . ibuprofen (ADVIL,MOTRIN) 600 MG tablet Take 1 tablet (600 mg total) by mouth every 6 (six) hours as needed. 30 tablet 1 02/10/2014 at 1900  . oxyCODONE-acetaminophen (PERCOCET/ROXICET) 5-325 MG per tablet Take 1-2 tablets by mouth every 6 (six) hours as needed. 30 tablet 0 02/10/2014 at 2300  . simethicone (MYLICON) 989 MG chewable tablet Chew 125 mg by mouth every 6 (six) hours as needed for flatulence.   02/10/2014 at Unknown time   Assessment: 43 yo female with hx of anemia with sepsis of urinary source. Imipenem for coverage  Goal of Therapy:    Plan:  Imipenem 500 mg iv q6h   Curlene Dolphin 02/13/2014,7:03 AM

## 2014-02-13 NOTE — Progress Notes (Signed)
02/13/2014 10:51 PM  Patient complaining about Right AC IV line. Asked if it could be taken out due to it burning and her arm turning red. I explained to her that she is a difficult stick and that we should try to keep the site. Once site was assessed and flushed patient still complained of pain and arm was still red right in the bend of her arm. Ashleigh NT +3 removed the IV with no problems. Patient still has access in her Right FA. Will continue to assess and monitor the patient and relay the message to day shift RNs.  [address], RN-BC, [place] [address] [place]  Number [phone]

## 2014-02-14 DIAGNOSIS — R1084 Generalized abdominal pain: Secondary | ICD-10-CM

## 2014-02-14 DIAGNOSIS — K760 Fatty (change of) liver, not elsewhere classified: Secondary | ICD-10-CM | POA: Diagnosis present

## 2014-02-14 DIAGNOSIS — I10 Essential (primary) hypertension: Secondary | ICD-10-CM | POA: Diagnosis present

## 2014-02-14 DIAGNOSIS — E669 Obesity, unspecified: Secondary | ICD-10-CM | POA: Diagnosis present

## 2014-02-14 LAB — BASIC METABOLIC PANEL
ANION GAP: 16 — AB (ref 5–15)
BUN: 5 mg/dL — ABNORMAL LOW (ref 6–23)
CHLORIDE: 100 meq/L (ref 96–112)
CO2: 21 mEq/L (ref 19–32)
CREATININE: 0.91 mg/dL (ref 0.50–1.10)
Calcium: 8.3 mg/dL — ABNORMAL LOW (ref 8.4–10.5)
GFR calc non Af Amer: 76 mL/min — ABNORMAL LOW (ref >90)
GFR, EST AFRICAN AMERICAN: 88 mL/min — AB (ref >90)
Glucose, Bld: 117 mg/dL — ABNORMAL HIGH (ref 70–99)
POTASSIUM: 4.5 meq/L (ref 3.7–5.3)
Sodium: 137 mEq/L (ref 137–147)

## 2014-02-14 LAB — URINE CULTURE
Colony Count: NO GROWTH
Culture: NO GROWTH

## 2014-02-14 LAB — CBC
HCT: 29.6 % — ABNORMAL LOW (ref 36.0–46.0)
HEMOGLOBIN: 9.7 g/dL — AB (ref 12.0–15.0)
MCH: 27.6 pg (ref 26.0–34.0)
MCHC: 32.8 g/dL (ref 30.0–36.0)
MCV: 84.3 fL (ref 78.0–100.0)
Platelets: 212 10*3/uL (ref 150–400)
RBC: 3.51 MIL/uL — AB (ref 3.87–5.11)
RDW: 14.6 % (ref 11.5–15.5)
WBC: 9.3 10*3/uL (ref 4.0–10.5)

## 2014-02-14 LAB — PRO B NATRIURETIC PEPTIDE: PRO B NATRI PEPTIDE: 338.1 pg/mL — AB (ref 0–125)

## 2014-02-14 MED ORDER — METOPROLOL TARTRATE 1 MG/ML IV SOLN
5.0000 mg | Freq: Four times a day (QID) | INTRAVENOUS | Status: DC | PRN
  Filled 2014-02-14: qty 5

## 2014-02-14 MED ORDER — HYDRALAZINE HCL 20 MG/ML IJ SOLN
5.0000 mg | Freq: Once | INTRAMUSCULAR | Status: AC
  Administered 2014-02-14: 5 mg via INTRAVENOUS
  Filled 2014-02-14: qty 1

## 2014-02-14 MED ORDER — CIPROFLOXACIN HCL 500 MG PO TABS
500.0000 mg | ORAL_TABLET | Freq: Two times a day (BID) | ORAL | Status: DC
  Administered 2014-02-14 – 2014-02-15 (×3): 500 mg via ORAL
  Filled 2014-02-14 (×6): qty 1

## 2014-02-14 NOTE — Progress Notes (Signed)
Pt.'s O2 sats were 98 at rest. While ambulating pt's sats ranged from 96-98. Pt back in bed and resting. Will continue to monitor. Tyna Jaksch, Therapist, sports.

## 2014-02-14 NOTE — Progress Notes (Signed)
PROGRESS NOTE  Brenda [name] PFX:[id number] DOB: December 25, 1970 DOA: 02/11/2014 PCP: Mora Bellman, MD  HPI/Recap of past 7 hours: 43 year old female admitted to hospitalist service on 11/8 for lower abdominal pain and found to have cystitis by CT as well as sinus tachycardia.  Today, patient doing okay, still feels tired. Had another temperature last night of 102  Assessment/Plan: Principal Problem:   UTI (lower urinary tract infection): Patient initially started on IV Rocephin. However is continued to spike fevers with a maximum temperature of 103. Repeat urine culture and blood cultures negative so far and antibiotics changed to Primaxin.  Fever again last night. May be slow to respond. Continue antibiotics Active Problems:   Tachycardia: Unclear etiology and patient has been requiring beta blocker. This was started yesterday and likely we will be more effective by tomorrow. TSH normal. No signs of hypoxia. There was concern of mild volume overload, but BNP relatively normal   Abdominal pain of unknown cause: Resolved. Initial reports of diarrhea on admission an outpatient and constipation, started on stool softeners   Pyrexia: As above  Obesity: Patient meets criteria with BMI greater than 30  Code Status: full code  Family Communication: left message with family  Disposition Plan: home once tachycardia improved and no fever times greater than 24 hours   Consultants:  none  Procedures:  none  Antibiotics:  IV Rocephin 11/8-11/10  IV Primaxin 11/10-present   Objective: BP 157/90 mmHg  Pulse 107  Temp(Src) 99.3 F (37.4 C) (Oral)  Resp 17  Ht 5\' 7"  (1.702 m)  Wt 111.812 kg (246 lb 8 oz)  BMI 38.60 kg/m2  SpO2 97%  LMP 12/01/2013 (Approximate)  Intake/Output Summary (Last 24 hours) at 02/14/14 1531 Last data filed at 02/14/14 0700  Gross per 24 hour  Intake    623 ml  Output      0 ml  Net    623 ml   Filed Weights   02/12/14 2218 02/13/14 2021    Weight: 109.907 kg (242 lb 4.8 oz) 111.812 kg (246 lb 8 oz)    Exam:   General:  Alert and oriented 3, fatigued  Cardiovascular: regular rate and rhythm, borderline tachycardia  Respiratory: clear to auscultation bilaterally  Abdomen: soft, obese, nontender, positive bowel sounds  Musculoskeletal: no clubbing or cyanosis or edema   Data Reviewed: Basic Metabolic Panel:  Recent Labs Lab 02/11/14 1900 02/12/14 0615 02/13/14 0511 02/14/14 0607  NA 134* 136* 134* 137  K 4.1 3.6* 3.8 4.5  CL 95* 100 96 100  CO2 26 23 22 21   GLUCOSE 116* 133* 125* 117*  BUN 7 6 5* 5*  CREATININE 0.96 0.89 0.99 0.91  CALCIUM 8.8 8.1* 8.0* 8.3*   Liver Function Tests:  Recent Labs Lab 02/11/14 1900 02/12/14 0615  AST 16 12  ALT 13 10  ALKPHOS 91 84  BILITOT 0.8 0.6  PROT 7.6 7.3  ALBUMIN 3.6 3.0*    Recent Labs Lab 02/12/14 0009  LIPASE 14   No results for input(s): AMMONIA in the last 168 hours. CBC:  Recent Labs Lab 02/11/14 1900 02/12/14 0615 02/13/14 0511 02/14/14 0607  WBC 16.3* 12.2* 12.1* 9.3  NEUTROABS  --  10.0*  --   --   HGB 11.4* 10.4* 9.6* 9.7*  HCT 35.1* 33.4* 30.4* 29.6*  MCV 85.4 86.3 85.6 84.3  PLT 253 207 203 212   Cardiac Enzymes:    Recent Labs Lab 02/12/14 0615 02/12/14 1211 02/12/14 1841  TROPONINI <0.30 <0.30 <0.30  BNP (last 3 results)  Recent Labs  11/01/13 0735 02/14/14 0607  PROBNP 61.4 338.1*   CBG: No results for input(s): GLUCAP in the last 168 hours.  Recent Results (from the past 240 hour(s))  Urine culture     Status: None   Collection Time: 02/11/14  6:20 PM  Result Value Ref Range Status   Specimen Description URINE, CLEAN CATCH  Final   Special Requests none Normal  Final   Culture  Setup Time   Final    02/12/2014 01:25 Performed at [place]   Final    25,000 COLONIES/ML Performed at [facility]    Culture   Final    Multiple bacterial morphotypes present, none  predominant. Suggest appropriate recollection if clinically indicated. Performed at [facility]    Report Status 02/13/2014 FINAL  Final  Culture, blood (routine x 2)     Status: None (Preliminary result)   Collection Time: 02/12/14  2:02 PM  Result Value Ref Range Status   Specimen Description BLOOD LEFT HAND  Final   Special Requests BOTTLES DRAWN AEROBIC ONLY 5CC  Final   Culture  Setup Time   Final    02/12/2014 19:46 Performed at [facility]    Culture   Final           BLOOD CULTURE RECEIVED NO GROWTH TO DATE CULTURE WILL BE HELD FOR 5 DAYS BEFORE ISSUING A FINAL NEGATIVE REPORT Performed at [facility]    Report Status PENDING  Incomplete  Culture, blood (routine x 2)     Status: None (Preliminary result)   Collection Time: 02/12/14  2:22 PM  Result Value Ref Range Status   Specimen Description BLOOD LEFT HAND  Final   Special Requests BOTTLES DRAWN AEROBIC ONLY 5CC  Final   Culture  Setup Time   Final    02/12/2014 19:46 Performed at [facility]    Culture   Final           BLOOD CULTURE RECEIVED NO GROWTH TO DATE CULTURE WILL BE HELD FOR 5 DAYS BEFORE ISSUING A FINAL NEGATIVE REPORT Performed at [facility]    Report Status PENDING  Incomplete  Clostridium Difficile by PCR     Status: None   Collection Time: 02/13/14  4:43 PM  Result Value Ref Range Status   C difficile by pcr NEGATIVE NEGATIVE Final     Studies: No results found.  Scheduled Meds: . docusate sodium  100 mg Oral BID  . enoxaparin (LOVENOX) injection  40 mg Subcutaneous Q24H  . ferrous fumarate  1 tablet Oral TID  . imipenem-cilastatin  500 mg Intravenous 4 times per day  . metoprolol tartrate  12.5 mg Oral BID  . pantoprazole  40 mg Oral Q0600  . sodium chloride  3 mL Intravenous Q12H    Continuous Infusions:    Time spent: 25 minutes  [facility] Pager [phone]. If 7PM-7AM, please contact night-coverage at  [email or web address], password [facility] 02/14/2014, 3:31 PM  LOS: 3 days

## 2014-02-14 NOTE — Progress Notes (Signed)
Patient does not currently have IV access. IV team attempted x2 nurses - they suggest placing a PICC line. Dr. [place] Pink changed patient's antibiotics to PO and stated he would monitor overnight and address PICC line concerns in the morning.   Joellen [place], RN.

## 2014-02-14 NOTE — Plan of Care (Signed)
Problem: Phase I Progression Outcomes Goal: Pain controlled with appropriate interventions Outcome: Completed/Met Date Met:  02/14/14 Goal: OOB as tolerated unless otherwise ordered Outcome: Completed/Met Date Met:  02/14/14 Goal: Adequate I & O Outcome: Completed/Met Date Met:  02/14/14 Goal: Tolerating diet Outcome: Completed/Met Date Met:  02/14/14

## 2014-02-15 ENCOUNTER — Encounter (HOSPITAL_COMMUNITY): Payer: Self-pay | Admitting: Radiology

## 2014-02-15 ENCOUNTER — Inpatient Hospital Stay (HOSPITAL_COMMUNITY): Payer: No Typology Code available for payment source

## 2014-02-15 LAB — BASIC METABOLIC PANEL
ANION GAP: 14 (ref 5–15)
BUN: 5 mg/dL — ABNORMAL LOW (ref 6–23)
CHLORIDE: 95 meq/L — AB (ref 96–112)
CO2: 24 mEq/L (ref 19–32)
Calcium: 8.3 mg/dL — ABNORMAL LOW (ref 8.4–10.5)
Creatinine, Ser: 0.87 mg/dL (ref 0.50–1.10)
GFR calc non Af Amer: 80 mL/min — ABNORMAL LOW (ref >90)
Glucose, Bld: 112 mg/dL — ABNORMAL HIGH (ref 70–99)
POTASSIUM: 3.7 meq/L (ref 3.7–5.3)
Sodium: 133 mEq/L — ABNORMAL LOW (ref 137–147)

## 2014-02-15 LAB — CBC
HCT: 30 % — ABNORMAL LOW (ref 36.0–46.0)
Hemoglobin: 9.5 g/dL — ABNORMAL LOW (ref 12.0–15.0)
MCH: 27.1 pg (ref 26.0–34.0)
MCHC: 31.7 g/dL (ref 30.0–36.0)
MCV: 85.5 fL (ref 78.0–100.0)
Platelets: 223 10*3/uL (ref 150–400)
RBC: 3.51 MIL/uL — AB (ref 3.87–5.11)
RDW: 14.6 % (ref 11.5–15.5)
WBC: 8.4 10*3/uL (ref 4.0–10.5)

## 2014-02-15 MED ORDER — IOHEXOL 300 MG/ML  SOLN
25.0000 mL | INTRAMUSCULAR | Status: AC
  Administered 2014-02-15 (×2): 25 mL via ORAL

## 2014-02-15 MED ORDER — SODIUM CHLORIDE 0.9 % IJ SOLN
10.0000 mL | INTRAMUSCULAR | Status: DC | PRN
  Administered 2014-02-16 – 2014-02-17 (×2): 10 mL
  Filled 2014-02-15 (×2): qty 40

## 2014-02-15 MED ORDER — METOPROLOL TARTRATE 25 MG PO TABS
25.0000 mg | ORAL_TABLET | Freq: Two times a day (BID) | ORAL | Status: DC
Start: 2014-02-15 — End: 2014-02-17
  Administered 2014-02-15 – 2014-02-17 (×4): 25 mg via ORAL
  Filled 2014-02-15 (×5): qty 1

## 2014-02-15 MED ORDER — IOHEXOL 300 MG/ML  SOLN
100.0000 mL | Freq: Once | INTRAMUSCULAR | Status: AC | PRN
  Administered 2014-02-15: 100 mL via INTRAVENOUS

## 2014-02-15 MED ORDER — WHITE PETROLATUM GEL
Status: AC
  Administered 2014-02-15: 23:00:00
  Filled 2014-02-15: qty 5

## 2014-02-15 MED ORDER — PHENAZOPYRIDINE HCL 200 MG PO TABS
200.0000 mg | ORAL_TABLET | Freq: Once | ORAL | Status: AC
  Administered 2014-02-15: 200 mg via ORAL
  Filled 2014-02-15: qty 1

## 2014-02-15 NOTE — Progress Notes (Signed)
Peripherally Inserted Central Catheter/Midline Placement  The IV Nurse has discussed with the patient and/or persons authorized to consent for the patient, the purpose of this procedure and the potential benefits and risks involved with this procedure.  The benefits include less needle sticks, lab draws from the catheter and patient may be discharged home with the catheter.  Risks include, but not limited to, infection, bleeding, blood clot (thrombus formation), and puncture of an artery; nerve damage and irregular heat beat.  Alternatives to this procedure were also discussed.  PICC/Midline Placement Documentation        Brenda [name] 02/15/2014, 5:47 PM Consent obtained by Jule Economy, RN

## 2014-02-15 NOTE — Plan of Care (Signed)
Problem: Phase II Progression Outcomes Goal: Progress activity as tolerated unless otherwise ordered Outcome: Completed/Met Date Met:  02/15/14 Goal: Tolerating diet Outcome: Completed/Met Date Met:  02/15/14 Goal: Voiding independently Outcome: Completed/Met Date Met:  02/15/14

## 2014-02-15 NOTE — Progress Notes (Signed)
02/15/2014 4:28 AM  Patient complaining of sharp pain in her lower abdomen and lots of pressure. She stated that she does not understand why she is still feeling this way after have being on antibiotics so long. Pain medication given. Flatulence medication given. On call MD notified. Will continue to assess and monitor the patient and inform Day Shift RN of new findings.   [address] RN-BC, [place] Number [phone]

## 2014-02-15 NOTE — Progress Notes (Signed)
Noted patient was switched to PO Ciprofloxacin for treatment of her UTI with loss of IV access.  Cipro 500mg  PO BID is appropriate for her renal function.  Pharmacy to sign off from the Primaxin consult. Please re-consult if needed. Thanks,  Almeta Monas. Marybeth Dandy, PharmD, BCPS Clinical Pharmacist Pager: [phone] 02/15/2014 1:11 PM

## 2014-02-15 NOTE — Progress Notes (Signed)
PROGRESS NOTE  Brenda [name] YIR:[id number] DOB: 1970/09/02 DOA: 02/11/2014 PCP: Mora Bellman, MD  HPI/Recap of past 15 hours: 43 year old female admitted to hospitalist service on 11/8 for lower abdominal pain and found to have cystitis by CT as well as sinus tachycardia.  Despite several days of antibiotics, patient continues to have low-grade temperatures.  Today patient doing okay. Her main complaint is of nausea. She lost IV access yesterday and started on oral Cipro. Continues to have low-grade temperatures last night.  Assessment/Plan: Principal Problem:   UTI (lower urinary tract infection): Patient initially started on IV Rocephin. However is continued to spike fevers with a maximum temperature of 103. Repeat urine culture and blood cultures negative so far and antibiotics changed to Primaxin. IV access lost, so have ordered PICC line and in the meantime on Cipro. Given continued fever and concerns for abdominal cramping, we'll repeat CT of abdomen pelvis. Patient does note that she's not had much of bowel movements over the last few days and that may be part of this. Will follow-up post CT.  Active Problems:   Tachycardia: Unclear etiology and patient has been requiring beta blocker. This was started yesterday and likely we will be more effective by tomorrow. TSH normal. No signs of hypoxia. There was concern of mild volume overload, but BNP relatively normal. Maybe continued infection, awaiting CT.     Pyrexia: As above  Obesity: Patient meets criteria with BMI greater than 30  Nausea: May be secondary to antibiotics versus constipation  Code Status: full code  Family Communication: left message with family  Disposition Plan: home once tachycardia improved and no fever times greater than 24 hours   Consultants:  none  Procedures:  none  Antibiotics:  IV Rocephin 11/8-11/10  IV Primaxin 11/10-present   Objective: BP 169/89 mmHg  Pulse 105  Temp(Src)  98.3 F (36.8 C) (Oral)  Resp 18  Ht 5\' 7"  (1.702 m)  Wt 111.267 kg (245 lb 4.8 oz)  BMI 38.41 kg/m2  SpO2 96%  LMP 12/01/2013 (Approximate)  Intake/Output Summary (Last 24 hours) at 02/15/14 1236 Last data filed at 02/15/14 0900  Gross per 24 hour  Intake    120 ml  Output      0 ml  Net    120 ml   Filed Weights   02/12/14 2218 02/13/14 2021 02/14/14 2116  Weight: 109.907 kg (242 lb 4.8 oz) 111.812 kg (246 lb 8 oz) 111.267 kg (245 lb 4.8 oz)    Exam:   General:  Alert and oriented 3, fatigued, nauseated  Cardiovascular: regular rate and rhythm, borderline tachycardia  Respiratory: clear to auscultation bilaterally  Abdomen: soft, obese, nontender, positive bowel sounds  Musculoskeletal: no clubbing or cyanosis or edema   Data Reviewed: Basic Metabolic Panel:  Recent Labs Lab 02/11/14 1900 02/12/14 0615 02/13/14 0511 02/14/14 0607 02/15/14 0508  NA 134* 136* 134* 137 133*  K 4.1 3.6* 3.8 4.5 3.7  CL 95* 100 96 100 95*  CO2 26 23 22 21 24   GLUCOSE 116* 133* 125* 117* 112*  BUN 7 6 5* 5* 5*  CREATININE 0.96 0.89 0.99 0.91 0.87  CALCIUM 8.8 8.1* 8.0* 8.3* 8.3*   Liver Function Tests:  Recent Labs Lab 02/11/14 1900 02/12/14 0615  AST 16 12  ALT 13 10  ALKPHOS 91 84  BILITOT 0.8 0.6  PROT 7.6 7.3  ALBUMIN 3.6 3.0*    Recent Labs Lab 02/12/14 0009  LIPASE 14   No results for input(s): AMMONIA  in the last 168 hours. CBC:  Recent Labs Lab 02/11/14 1900 02/12/14 0615 02/13/14 0511 02/14/14 0607 02/15/14 0508  WBC 16.3* 12.2* 12.1* 9.3 8.4  NEUTROABS  --  10.0*  --   --   --   HGB 11.4* 10.4* 9.6* 9.7* 9.5*  HCT 35.1* 33.4* 30.4* 29.6* 30.0*  MCV 85.4 86.3 85.6 84.3 85.5  PLT 253 207 203 212 223   Cardiac Enzymes:    Recent Labs Lab 02/12/14 0615 02/12/14 1211 02/12/14 1841  TROPONINI <0.30 <0.30 <0.30   BNP (last 3 results)  Recent Labs  11/01/13 0735 02/14/14 0607  PROBNP 61.4 338.1*   CBG: No results for input(s):  GLUCAP in the last 168 hours.  Recent Results (from the past 240 hour(s))  Urine culture     Status: None   Collection Time: 02/11/14  6:20 PM  Result Value Ref Range Status   Specimen Description URINE, CLEAN CATCH  Final   Special Requests none Normal  Final   Culture  Setup Time   Final    02/12/2014 01:25 Performed at [place]   Final    25,000 COLONIES/ML Performed at [facility]    Culture   Final    Multiple bacterial morphotypes present, none predominant. Suggest appropriate recollection if clinically indicated. Performed at [facility]    Report Status 02/13/2014 FINAL  Final  Culture, blood (routine x 2)     Status: None (Preliminary result)   Collection Time: 02/12/14  2:02 PM  Result Value Ref Range Status   Specimen Description BLOOD LEFT HAND  Final   Special Requests BOTTLES DRAWN AEROBIC ONLY 5CC  Final   Culture  Setup Time   Final    02/12/2014 19:46 Performed at [facility]    Culture   Final           BLOOD CULTURE RECEIVED NO GROWTH TO DATE CULTURE WILL BE HELD FOR 5 DAYS BEFORE ISSUING A FINAL NEGATIVE REPORT Performed at [facility]    Report Status PENDING  Incomplete  Culture, blood (routine x 2)     Status: None (Preliminary result)   Collection Time: 02/12/14  2:22 PM  Result Value Ref Range Status   Specimen Description BLOOD LEFT HAND  Final   Special Requests BOTTLES DRAWN AEROBIC ONLY 5CC  Final   Culture  Setup Time   Final    02/12/2014 19:46 Performed at [facility]    Culture   Final           BLOOD CULTURE RECEIVED NO GROWTH TO DATE CULTURE WILL BE HELD FOR 5 DAYS BEFORE ISSUING A FINAL NEGATIVE REPORT Performed at [facility]    Report Status PENDING  Incomplete  Urine culture     Status: None   Collection Time: 02/13/14  3:25 PM  Result Value Ref Range Status   Specimen Description URINE, RANDOM  Final   Special Requests NONE  Final   Culture   Setup Time   Final    02/13/2014 22:19 Performed at [place] Performed at [facility]   Final   Culture NO GROWTH Performed at [facility]   Final   Report Status 02/14/2014 FINAL  Final  Clostridium Difficile by PCR     Status: None   Collection Time: 02/13/14  4:43 PM  Result Value Ref Range Status   C  difficile by pcr NEGATIVE NEGATIVE Final     Studies: No results found.  Scheduled Meds: . ciprofloxacin  500 mg Oral BID  . docusate sodium  100 mg Oral BID  . enoxaparin (LOVENOX) injection  40 mg Subcutaneous Q24H  . ferrous fumarate  1 tablet Oral TID  . metoprolol tartrate  25 mg Oral BID  . pantoprazole  40 mg Oral Q0600  . sodium chloride  3 mL Intravenous Q12H    Continuous Infusions:    Time spent: 15 minutes  [place] [facility] Pager [phone]. If 7PM-7AM, please contact night-coverage at [email or web address], password [facility] 02/15/2014, 12:36 PM  LOS: 4 days

## 2014-02-16 DIAGNOSIS — J189 Pneumonia, unspecified organism: Secondary | ICD-10-CM | POA: Diagnosis present

## 2014-02-16 DIAGNOSIS — A09 Infectious gastroenteritis and colitis, unspecified: Secondary | ICD-10-CM | POA: Diagnosis present

## 2014-02-16 MED ORDER — LEVOFLOXACIN 500 MG PO TABS
500.0000 mg | ORAL_TABLET | Freq: Every day | ORAL | Status: DC
  Filled 2014-02-16: qty 1

## 2014-02-16 MED ORDER — HYDRALAZINE HCL 20 MG/ML IJ SOLN: 10.0000 mg | Freq: Four times a day (QID) | INTRAMUSCULAR | Status: DC | PRN

## 2014-02-16 MED ORDER — LEVOFLOXACIN IN D5W 750 MG/150ML IV SOLN
750.0000 mg | INTRAVENOUS | Status: DC
  Administered 2014-02-16 – 2014-02-17 (×2): 750 mg via INTRAVENOUS
  Filled 2014-02-16 (×2): qty 150

## 2014-02-16 NOTE — Plan of Care (Signed)
Problem: Phase I Progression Outcomes Goal: Vital Signs stable- temperature less than 102 Outcome: Completed/Met Date Met:  02/16/14

## 2014-02-16 NOTE — Progress Notes (Signed)
PROGRESS NOTE  Brenda [name] XVQ:[id number] DOB: 10-07-1970 DOA: 02/11/2014 PCP: Mora Bellman, MD  HPI/Recap of past 59 hours: 43 year old female admitted to hospitalist service on 11/8 for lower abdominal pain and found to have cystitis by CT as well as sinus tachycardia.  Despite several days of antibiotics, patient continued to have low-grade temperatures.  After several days, patient lost IV access and change over to oral Cipro on 11/12. Continue to have abdominal cramping so repeat CT done which noted patchy airspace disease consistent with possible pneumonia and signs consistent with a colitis. Patient's cystitis looked to have resolved.  Today patient is doing a little bit better. Still mildly nauseated. She's not had any fevers since started on Cipro.  Assessment/Plan: Principal Problem:   UTI (lower urinary tract infection)/colitis/bilateral lower lobe pneumonia: Patient initially started on IV Rocephin. However is continued to spike fevers with a maximum temperature of 103. Repeat urine culture and blood cultures negative so far and antibiotics changed to Primaxin. IV access lost, so have ordered PICC line and in the meantime on Cipro. Given continued fever and concerns for abdominal cramping, a repeat CT of abdomen was done 11/12 which noted resolved cystitis, noted colitis and patchy airspace disease bilaterally consistent with pneumonia. Patient seems to respond well to Cipro and has not had even low-grade temperatures since last night. Adjusted antibiotics to Levaquin and now with PICC line given, will change to IV which would cover both pneumonia as well as colitis. Urinary tract infection felt to be fully treated. We'll give dose of IV antibiotics today and tomorrow and if patient feeling better and discharged home tomorrow   Active Problems:   Tachycardia:has improved and better controlled with beta blocker. Suspect secondary to persistent infection and should have much  improvement with antibiotic adjustment over timeT.     Pyrexia: likely secondary to persistent pneumonia and colitis, no fever or even low-grade temperature in last 24 hours and started on fluoroquinolone  Obesity: Patient meets criteria with BMI greater than 30  Nausea: May be secondary to antibiotics.  Code Status: full code  Family Communication:spoke with sisters at the bedside  Disposition Plan: home likely tomorrow   Consultants:  none  Procedures:  none  Antibiotics:  IV Rocephin 11/8-11/10  IV Primaxin 11/10-11/12  By mouth Cipro 11/12-11/13  IV Levaquin 11/13-present   Objective: BP 144/93 mmHg  Pulse 98  Temp(Src) 98.3 F (36.8 C) (Oral)  Resp 18  Ht 5\' 7"  (1.702 m)  Wt 111.267 kg (245 lb 4.8 oz)  BMI 38.41 kg/m2  SpO2 95%  LMP 12/01/2013 (Approximate)  Intake/Output Summary (Last 24 hours) at 02/16/14 1240 Last data filed at 02/16/14 0530  Gross per 24 hour  Intake    360 ml  Output      0 ml  Net    360 ml   Filed Weights   02/13/14 2021 02/14/14 2116 02/15/14 2048  Weight: 111.812 kg (246 lb 8 oz) 111.267 kg (245 lb 4.8 oz) 111.267 kg (245 lb 4.8 oz)    Exam:   General:  Alert and oriented 3, no acute distress  Cardiovascular: regular rate and rhythm, borderline tachycardia  Respiratory: clear to auscultation bilaterally  Abdomen: soft, obese, nontender, positive bowel sounds  Musculoskeletal: no clubbing or cyanosis or edema   Data Reviewed: Basic Metabolic Panel:  Recent Labs Lab 02/11/14 1900 02/12/14 0615 02/13/14 0511 02/14/14 0607 02/15/14 0508  NA 134* 136* 134* 137 133*  K 4.1 3.6* 3.8 4.5 3.7  CL  95* 100 96 100 95*  CO2 26 23 22 21 24   GLUCOSE 116* 133* 125* 117* 112*  BUN 7 6 5* 5* 5*  CREATININE 0.96 0.89 0.99 0.91 0.87  CALCIUM 8.8 8.1* 8.0* 8.3* 8.3*   Liver Function Tests:  Recent Labs Lab 02/11/14 1900 02/12/14 0615  AST 16 12  ALT 13 10  ALKPHOS 91 84  BILITOT 0.8 0.6  PROT 7.6 7.3    ALBUMIN 3.6 3.0*    Recent Labs Lab 02/12/14 0009  LIPASE 14   No results for input(s): AMMONIA in the last 168 hours. CBC:  Recent Labs Lab 02/11/14 1900 02/12/14 0615 02/13/14 0511 02/14/14 0607 02/15/14 0508  WBC 16.3* 12.2* 12.1* 9.3 8.4  NEUTROABS  --  10.0*  --   --   --   HGB 11.4* 10.4* 9.6* 9.7* 9.5*  HCT 35.1* 33.4* 30.4* 29.6* 30.0*  MCV 85.4 86.3 85.6 84.3 85.5  PLT 253 207 203 212 223   Cardiac Enzymes:    Recent Labs Lab 02/12/14 0615 02/12/14 1211 02/12/14 1841  TROPONINI <0.30 <0.30 <0.30   BNP (last 3 results)  Recent Labs  11/01/13 0735 02/14/14 0607  PROBNP 61.4 338.1*   CBG: No results for input(s): GLUCAP in the last 168 hours.  Recent Results (from the past 240 hour(s))  Urine culture     Status: None   Collection Time: 02/11/14  6:20 PM  Result Value Ref Range Status   Specimen Description URINE, CLEAN CATCH  Final   Special Requests none Normal  Final   Culture  Setup Time   Final    02/12/2014 01:25 Performed at [facility]   Final    25,000 COLONIES/ML Performed at [facility]    Culture   Final    Multiple bacterial morphotypes present, none predominant. Suggest appropriate recollection if clinically indicated. Performed at [facility]    Report Status 02/13/2014 FINAL  Final  Culture, blood (routine x 2)     Status: None (Preliminary result)   Collection Time: 02/12/14  2:02 PM  Result Value Ref Range Status   Specimen Description BLOOD LEFT HAND  Final   Special Requests BOTTLES DRAWN AEROBIC ONLY 5CC  Final   Culture  Setup Time   Final    02/12/2014 19:46 Performed at [facility]    Culture   Final           BLOOD CULTURE RECEIVED NO GROWTH TO DATE CULTURE WILL BE HELD FOR 5 DAYS BEFORE ISSUING A FINAL NEGATIVE REPORT Performed at [facility]    Report Status PENDING  Incomplete  Culture, blood (routine x 2)     Status: None (Preliminary result)    Collection Time: 02/12/14  2:22 PM  Result Value Ref Range Status   Specimen Description BLOOD LEFT HAND  Final   Special Requests BOTTLES DRAWN AEROBIC ONLY 5CC  Final   Culture  Setup Time   Final    02/12/2014 19:46 Performed at [facility]    Culture   Final           BLOOD CULTURE RECEIVED NO GROWTH TO DATE CULTURE WILL BE HELD FOR 5 DAYS BEFORE ISSUING A FINAL NEGATIVE REPORT Performed at [facility]    Report Status PENDING  Incomplete  Urine culture     Status: None   Collection Time: 02/13/14  3:25 PM  Result Value Ref Range Status  Specimen Description URINE, RANDOM  Final   Special Requests NONE  Final   Culture  Setup Time   Final    02/13/2014 22:19 Performed at [place] Performed at [facility]   Final   Culture NO GROWTH Performed at [facility]   Final   Report Status 02/14/2014 FINAL  Final  Clostridium Difficile by PCR     Status: None   Collection Time: 02/13/14  4:43 PM  Result Value Ref Range Status   C difficile by pcr NEGATIVE NEGATIVE Final     Studies: Ct Abdomen Pelvis W Contrast  02/15/2014   CLINICAL DATA:  Lower abdominal pressure and pain with fever 5 days. Nodular this morning.  EXAM: CT ABDOMEN AND PELVIS WITH CONTRAST  TECHNIQUE: Multidetector CT imaging of the abdomen and pelvis was performed using the standard protocol following bolus administration of intravenous contrast.  CONTRAST:  126mL OMNIPAQUE IOHEXOL 300 MG/ML  SOLN  COMPARISON:  02/11/2014  FINDINGS: Lung bases demonstrate patchy airspace opacification right worse than left which may be due to infection and less likely atelectasis.  Abdominal images demonstrate mild gallbladder dilatation. The spleen, liver, pancreas and adrenal glands are normal. Kidneys are normal in size without hydronephrosis or nephrolithiasis. The appendix is within normal. Abdominal aorta is within normal. No evidence of bowel  obstruction. Contrast is present throughout the colon.  There is mild stranding of the mesenteric fat over the lower abdomen and pelvis just above the bladder as these changes are slightly worse. There is subtle wall thickening of the rectosigmoid colon. There is mild increased free fluid over the left paracolic gutter. There is no free peritoneal air. There is continued mild bladder wall thickening unchanged. No evidence of focal fluid collection to suggest abscess. Remainder of the exam is unchanged.  IMPRESSION: Slight worsening free fluid and stranding of the mesentery over the lower abdomen/ pelvis just above the bladder. No definite abscess. No free peritoneal air. There is minimal wall thickening of the rectosigmoid colon as cannot exclude an acute colitis. There is continued minimal bladder wall thickening as cystitis is less likely.  Patchy airspace process over the lung bases right worse than left likely due to pneumonia.   Electronically Signed   By: Marin Olp M.D.   On: 02/15/2014 23:49    Scheduled Meds: . docusate sodium  100 mg Oral BID  . enoxaparin (LOVENOX) injection  40 mg Subcutaneous Q24H  . ferrous fumarate  1 tablet Oral TID  . levofloxacin (LEVAQUIN) IV  750 mg Intravenous Q24H  . metoprolol tartrate  25 mg Oral BID  . pantoprazole  40 mg Oral Q0600  . sodium chloride  3 mL Intravenous Q12H    Continuous Infusions:    Time spent: 25 minutes  [facility] Pager [phone]. If 7PM-7AM, please contact night-coverage at [email or web address], password [facility] 02/16/2014, 12:40 PM  LOS: 5 days

## 2014-02-17 DIAGNOSIS — K760 Fatty (change of) liver, not elsewhere classified: Secondary | ICD-10-CM

## 2014-02-17 MED ORDER — LEVOFLOXACIN 500 MG PO TABS: 500.0000 mg | ORAL_TABLET | Freq: Every day | ORAL | Status: AC

## 2014-02-17 MED ORDER — FLUCONAZOLE 150 MG PO TABS: ORAL_TABLET | ORAL | Status: DC

## 2014-02-17 MED ORDER — METOPROLOL TARTRATE 25 MG PO TABS: 25.0000 mg | ORAL_TABLET | Freq: Two times a day (BID) | ORAL | Status: DC

## 2014-02-17 NOTE — Discharge Summary (Signed)
Discharge Summary  Asiya Cutbirth [facility]:[id number] DOB: [birth year]  PCP: Steva Colder, PA-C  Admit date: 02/11/2014 Discharge date: 02/17/2014  Time spent: 25 minutes  Recommendations for Outpatient Follow-up:  1. New medication: Levaquin 500 mg by mouth 7 days 2. Diflucan 150 mg by mouth 1 dose after Levaquin finished 3. Lopressor 25 mg by mouth twice a day 4. Patient has a scheduled appointment with her PCP in approximately 3 weeks. At that time her blood pressure and heart rate can be reassessed to see if Lopressor should be continued   Discharge Diagnoses:  Active Hospital Problems   Diagnosis Date Noted  . Colitis, infectious 02/16/2014  . CAP (community acquired pneumonia) 02/16/2014  . Obesity (BMI 30-39.9) 02/14/2014  . Essential hypertension 02/14/2014  . Hepatic steatosis 02/14/2014  . UTI (lower urinary tract infection) 02/12/2014  . Tachycardia 02/12/2014  . Generalized abdominal pain   . Pyrexia     Resolved Hospital Problems   Diagnosis Date Noted Date Resolved  No resolved problems to display.    Discharge Condition: improved, being discharged home  Diet recommendation: low-sodium  Filed Weights   02/14/14 2116 02/15/14 2048 02/16/14 2045  Weight: 111.267 kg (245 lb 4.8 oz) 111.267 kg (245 lb 4.8 oz) 111.267 kg (245 lb 4.8 oz)    History of present illness:  43 year old female admitted to hospitalist service on 11/8 for lower abdominal pain and found to have cystitis by CT as well as sinus tachycardia.  Hospital Course:  Principal Problem:   Colitis, infectious: Initially when presented in the emergency room, CT noted cystitis and patient started on IV Rocephin and with persistent fevers, this was changed to more broader spectrum antibiotic. Patient continued of tachycardia and low-grade temperatures and by 11/12 with symptoms persisting repeat CT done. CT noted questionable patchy bilateral airspace disease with pneumonia as consideration. Cystitis  resolved, but CT also noted a significant colitis. With antibiotics adjusted, patient responded well to Levaquin and has been afebrile 48 hours and is tolerating by mouth and felt medically stable for discharge Active Problems:   UTI (lower urinary tract infection):cystitis noted on initial CTand confirmed by UA. By repeat CT, cystitis resolved, cleared by antibiotics.   Tachycardia: Felt to be in part from persistent infection. Should improve with beta blocker plus infection control    Generalized abdominal pain: secondary to colitis.    Pyrexia: Persistent secondary to unresolved infection. With adjustment of antibiotics, I day of discharge, patient was afebrile 48 hours.    Obesity (BMI 30-39.9): Patient meets criteria with BMI greater than 30.    Essential hypertension: Recently diagnosed and patient started on HCTZ prior to admission. During her hospitalization, she continued to have persistent blood pressure elevation and tachycardia leading to restarting a beta blocker with titration up to 25 twice a day. By day of discharge,patient's heart rate still in high 80s with a blood pressure 145/96. Some of this should improve as the rest of her infection resolves. Her PCP can follow up as scheduled in 3 weeks and at that time assess to see if she still should stay on beta blocker.    Hepatic steatosis: Incidentally noted on abdominal CT. Can be followed by patient's primary care physician.    CAP (community acquired pneumonia)colon seen on CT incidentally. Patient with no respiratory symptoms. Could be incidental finding versus some mild pneumonia. Either way, would be treated by Levaquin course.   Procedures:  none  Consultations:  none  Discharge Exam: BP 145/96 mmHg  Pulse 90  Temp(Src) 98.2 F (36.8 C) (Oral)  Resp 18  Ht 5\' 7"  (1.702 m)  Wt 111.267 kg (245 lb 4.8 oz)  BMI 38.41 kg/m2  SpO2 98%  LMP 12/01/2013 (Approximate)  General: alert and oriented 3, no acute  distress Cardiovascular: regular rate and rhythm, S1-S2 Respiratory: clear to auscultation bilaterally  Discharge Instructions You were cared for by a hospitalist during your hospital stay. If you have any questions about your discharge medications or the care you received while you were in the hospital after you are discharged, you can call the unit and asked to speak with the hospitalist on call if the hospitalist that took care of you is not available. Once you are discharged, your primary care physician will handle any further medical issues. Please note that NO REFILLS for any discharge medications will be authorized once you are discharged, as it is imperative that you return to your primary care physician (or establish a relationship with a primary care physician if you do not have one) for your aftercare needs so that they can reassess your need for medications and monitor your lab values.  Discharge Instructions    Diet - low sodium heart healthy    Complete by:  As directed      Increase activity slowly    Complete by:  As directed             Medication List    TAKE these medications        docusate sodium 100 MG capsule  Commonly known as:  COLACE  Take 100 mg by mouth 2 (two) times daily as needed for mild constipation.     ferrous fumarate 325 (106 FE) MG Tabs tablet  Commonly known as:  HEMOCYTE - 106 mg FE  Take 1 tablet by mouth 3 (three) times daily.     fluconazole 150 MG tablet  Commonly known as:  DIFLUCAN  150 mg tablet by mouth once, take after completion of full course of other antibiotics     hydrochlorothiazide 12.5 MG capsule  Commonly known as:  MICROZIDE  Take 12.5 mg by mouth daily.     ibuprofen 600 MG tablet  Commonly known as:  ADVIL,MOTRIN  Take 1 tablet (600 mg total) by mouth every 6 (six) hours as needed.     levofloxacin 500 MG tablet  Commonly known as:  LEVAQUIN  Take 1 tablet (500 mg total) by mouth daily.  Start taking on:   02/18/2014     metoprolol tartrate 25 MG tablet  Commonly known as:  LOPRESSOR  Take 1 tablet (25 mg total) by mouth 2 (two) times daily.     oxyCODONE-acetaminophen 5-325 MG per tablet  Commonly known as:  PERCOCET/ROXICET  Take 1-2 tablets by mouth every 6 (six) hours as needed.     simethicone 125 MG chewable tablet  Commonly known as:  MYLICON  Chew 401 mg by mouth every 6 (six) hours as needed for flatulence.       No Known Allergies     Follow-up Information    Follow up with CONSTANT,PEGGY, MD.   Specialty:  Obstetrics and Gynecology   Contact information:   [address] [place] [address] [phone]        The results of significant diagnostics from this hospitalization (including imaging, microbiology, ancillary and laboratory) are listed below for reference.    Significant Diagnostic Studies: Ct Angio Chest Pe W/cm &/or Wo Cm  02/12/2014  CLINICAL DATA:  Shortness of breath. Trouble with heart racing since February 11, 2014.  EXAM: CT ANGIOGRAPHY CHEST WITH CONTRAST  TECHNIQUE: Multidetector CT imaging of the chest was performed using the standard protocol during bolus administration of intravenous contrast. Multiplanar CT image reconstructions and MIPs were obtained to evaluate the vascular anatomy.  CONTRAST:  46mL OMNIPAQUE IOHEXOL 350 MG/ML SOLN  COMPARISON:  Chest x-ray February 12, 2014  FINDINGS: There is no pulmonary embolus. The aorta is normal without aneurysmal dilatation or dissection. There is no mediastinal or hilar lymphadenopathy. The heart size is upper limits of normal. There is no pericardial effusion. Images of the lungs demonstrate dependent atelectasis of the posterior lung bases, right greater than left. There is no focal pneumonia or pleural effusion. The visualized upper abdominal structures are unremarkable. There is a small hiatal hernia.  Review of the MIP images confirms the above findings.  IMPRESSION: No pulmonary embolus. Mild  dependent atelectasis of the posterior lung bases, right greater than left.   Electronically Signed   By: Abelardo Diesel M.D.   On: 02/12/2014 11:45   US Abdomen Complete  02/12/2014   CLINICAL DATA:  Abdominal pain.  EXAM: ULTRASOUND ABDOMEN COMPLETE  COMPARISON:  CT abdomen and pelvis 02/11/2014.  FINDINGS: Gallbladder: A very small amount of sludge is seen in the gallbladder but there is no gallbladder wall thickening or pericholecystic fluid. Sonographer reports negative Murphy's sign.  Common bile duct: Diameter: 0.4 cm.  Liver: The liver demonstrates increased echogenicity and somewhat coarsened echotexture. No focal lesion or intrahepatic biliary ductal dilatation is identified.  IVC: No abnormality visualized.  Pancreas: Visualized portion unremarkable.  Spleen: Size and appearance within normal limits.  Right Kidney: Length: 12.4 cm. Echogenicity within normal limits. No mass or hydronephrosis visualized.  Left Kidney: Length: 11.1 cm. Echogenicity within normal limits. No mass or hydronephrosis visualized.  Abdominal aorta: No aneurysm visualized.  Other findings: None.  IMPRESSION: Small amount of gallbladder sludge. Negative for gallstones or evidence of acute cholecystitis.  Fatty infiltration of the liver.   Electronically Signed   By: Inge Rise M.D.   On: 02/12/2014 01:34   Ct Abdomen Pelvis W Contrast  02/15/2014   CLINICAL DATA:  Lower abdominal pressure and pain with fever 5 days. Nodular this morning.  EXAM: CT ABDOMEN AND PELVIS WITH CONTRAST  TECHNIQUE: Multidetector CT imaging of the abdomen and pelvis was performed using the standard protocol following bolus administration of intravenous contrast.  CONTRAST:  191mL OMNIPAQUE IOHEXOL 300 MG/ML  SOLN  COMPARISON:  02/11/2014  FINDINGS: Lung bases demonstrate patchy airspace opacification right worse than left which may be due to infection and less likely atelectasis.  Abdominal images demonstrate mild gallbladder dilatation. The  spleen, liver, pancreas and adrenal glands are normal. Kidneys are normal in size without hydronephrosis or nephrolithiasis. The appendix is within normal. Abdominal aorta is within normal. No evidence of bowel obstruction. Contrast is present throughout the colon.  There is mild stranding of the mesenteric fat over the lower abdomen and pelvis just above the bladder as these changes are slightly worse. There is subtle wall thickening of the rectosigmoid colon. There is mild increased free fluid over the left paracolic gutter. There is no free peritoneal air. There is continued mild bladder wall thickening unchanged. No evidence of focal fluid collection to suggest abscess. Remainder of the exam is unchanged.  IMPRESSION: Slight worsening free fluid and stranding of the mesentery over the lower abdomen/ pelvis just above the bladder.  No definite abscess. No free peritoneal air. There is minimal wall thickening of the rectosigmoid colon as cannot exclude an acute colitis. There is continued minimal bladder wall thickening as cystitis is less likely.  Patchy airspace process over the lung bases right worse than left likely due to pneumonia.   Electronically Signed   By: Marin Olp M.D.   On: 02/15/2014 23:49   Ct Abdomen Pelvis W Contrast  02/11/2014   CLINICAL DATA:  Pt had abdominal hysterectomy on Sep 8th 2015 without any complications. Pt started yesterday with lower abd pain, took 1200mg  ibuprofen for pain yesterday with minimal relief. Pressure when she voids, noticed a strong odor with urine and a dark color. Fever.  EXAM: CT ABDOMEN AND PELVIS WITH CONTRAST  TECHNIQUE: Multidetector CT imaging of the abdomen and pelvis was performed using the standard protocol following bolus administration of intravenous contrast.  CONTRAST:  133mL OMNIPAQUE IOHEXOL 300 MG/ML  SOLN  COMPARISON:  None.  FINDINGS: Bladder is only mildly distended. The wall appears thickened with some hazy stranding in the perivesicular  fat. Additional fat stranding is seen adjacent to the ovaries and adnexa. No pelvic fluid collection is seen to suggest an abscess. Uterus is surgically absent.  There are air-fluid levels within the small bowel and colon. No bowel wall thickening. No bowel dilation is seen to suggest obstruction. This suggests a mild diffuse adynamic ileus.  Lungs show mild basilar atelectasis.  Heart is normal in size.  Fatty infiltration of the liver.  No liver mass or focal lesion.  Spleen, gallbladder, pancreas, adrenal glands: Normal. Normal kidneys and ureters.  No pathologically enlarged lymph nodes.  No ascites.  Mild loss of disc height at L4-L5. Bony structures otherwise unremarkable.  IMPRESSION: 1. Bladder wall thickening with adjacent fat inflammation. Findings are consistent with cystitis. 2. No abnormal fluid collections. No evidence of a postoperative abscess. 3. Air-fluid levels within the colon and small bowel with no evidence of bowel wall thickening or inflammation. Findings are consistent with a mild adynamic ileus. 4. Hepatic steatosis.   Electronically Signed   By: Lajean Manes M.D.   On: 02/11/2014 21:19   Dg Chest Portable 1 View  02/12/2014   CLINICAL DATA:  Shortness of breath/hypoxia.  EXAM: PORTABLE CHEST - 1 VIEW  COMPARISON:  11/02/2013 as well as CT abdomen/ pelvis earlier today.  FINDINGS: Lungs are somewhat hypoinflated with minimal density in the right infrahilar region likely vascular crowding. No evidence of effusion or pneumothorax. Cardiomediastinal silhouette and remainder the exam is unchanged.  IMPRESSION: No active disease.   Electronically Signed   By: Marin Olp M.D.   On: 02/12/2014 00:47    Microbiology: Recent Results (from the past 240 hour(s))  Urine culture     Status: None   Collection Time: 02/11/14  6:20 PM  Result Value Ref Range Status   Specimen Description URINE, CLEAN CATCH  Final   Special Requests none Normal  Final   Culture  Setup Time   Final     02/12/2014 01:25 Performed at [place]   Final    25,000 COLONIES/ML Performed at [facility]    Culture   Final    Multiple bacterial morphotypes present, none predominant. Suggest appropriate recollection if clinically indicated. Performed at [facility]    Report Status 02/13/2014 FINAL  Final  Culture, blood (routine x 2)     Status: None (Preliminary result)   Collection Time:  02/12/14  2:02 PM  Result Value Ref Range Status   Specimen Description BLOOD LEFT HAND  Final   Special Requests BOTTLES DRAWN AEROBIC ONLY 5CC  Final   Culture  Setup Time   Final    02/12/2014 19:46 Performed at [facility]    Culture   Final           BLOOD CULTURE RECEIVED NO GROWTH TO DATE CULTURE WILL BE HELD FOR 5 DAYS BEFORE ISSUING A FINAL NEGATIVE REPORT Performed at [facility]    Report Status PENDING  Incomplete  Culture, blood (routine x 2)     Status: None (Preliminary result)   Collection Time: 02/12/14  2:22 PM  Result Value Ref Range Status   Specimen Description BLOOD LEFT HAND  Final   Special Requests BOTTLES DRAWN AEROBIC ONLY 5CC  Final   Culture  Setup Time   Final    02/12/2014 19:46 Performed at [facility]    Culture   Final           BLOOD CULTURE RECEIVED NO GROWTH TO DATE CULTURE WILL BE HELD FOR 5 DAYS BEFORE ISSUING A FINAL NEGATIVE REPORT Performed at [facility]    Report Status PENDING  Incomplete  Urine culture     Status: None   Collection Time: 02/13/14  3:25 PM  Result Value Ref Range Status   Specimen Description URINE, RANDOM  Final   Special Requests NONE  Final   Culture  Setup Time   Final    02/13/2014 22:19 Performed at [place] Performed at [facility]   Final   Culture NO GROWTH Performed at [facility]   Final   Report Status 02/14/2014 FINAL  Final  Clostridium Difficile by PCR     Status: None    Collection Time: 02/13/14  4:43 PM  Result Value Ref Range Status   C difficile by pcr NEGATIVE NEGATIVE Final     Labs: Basic Metabolic Panel:  Recent Labs Lab 02/11/14 1900 02/12/14 0615 02/13/14 0511 02/14/14 0607 02/15/14 0508  NA 134* 136* 134* 137 133*  K 4.1 3.6* 3.8 4.5 3.7  CL 95* 100 96 100 95*  CO2 26 23 22 21 24   GLUCOSE 116* 133* 125* 117* 112*  BUN 7 6 5* 5* 5*  CREATININE 0.96 0.89 0.99 0.91 0.87  CALCIUM 8.8 8.1* 8.0* 8.3* 8.3*   Liver Function Tests:  Recent Labs Lab 02/11/14 1900 02/12/14 0615  AST 16 12  ALT 13 10  ALKPHOS 91 84  BILITOT 0.8 0.6  PROT 7.6 7.3  ALBUMIN 3.6 3.0*    Recent Labs Lab 02/12/14 0009  LIPASE 14   No results for input(s): AMMONIA in the last 168 hours. CBC:  Recent Labs Lab 02/11/14 1900 02/12/14 0615 02/13/14 0511 02/14/14 0607 02/15/14 0508  WBC 16.3* 12.2* 12.1* 9.3 8.4  NEUTROABS  --  10.0*  --   --   --   HGB 11.4* 10.4* 9.6* 9.7* 9.5*  HCT 35.1* 33.4* 30.4* 29.6* 30.0*  MCV 85.4 86.3 85.6 84.3 85.5  PLT 253 207 203 212 223   Cardiac Enzymes:  Recent Labs Lab 02/12/14 0615 02/12/14 1211 02/12/14 1841  TROPONINI <0.30 <0.30 <0.30   BNP: BNP (last 3 results)  Recent Labs  11/01/13 0735 02/14/14 0607  PROBNP 61.4 338.1*   CBG: No results for input(s): GLUCAP in the last 168 hours.  Signed:  Annita Brod  [facility] 02/17/2014, 2:48 PM

## 2014-02-17 NOTE — Progress Notes (Signed)
Patient to be discharged to home. PICC line removed per Dr. Lyman Speller orders. Discharge instructions reviewed with patient.  Joellen Jersey, RN.

## 2014-02-17 NOTE — Discharge Instructions (Signed)

## 2014-02-17 NOTE — Plan of Care (Signed)
Problem: Phase I Progression Outcomes Goal: Initial discharge plan identified Outcome: Completed/Met Date Met:  02/17/14

## 2014-02-17 NOTE — Plan of Care (Signed)
Problem: Phase III Progression Outcomes Goal: Tolerating diet Outcome: Completed/Met Date Met:  02/17/14

## 2014-02-17 NOTE — Plan of Care (Signed)
Problem: Phase I Progression Outcomes Goal: Hemodynamically stable Outcome: Completed/Met Date Met:  02/17/14 Goal: Other Phase I Outcomes/Goals Outcome: Not Applicable Date Met:  44/96/75  Problem: Phase II Progression Outcomes Goal: Vital signs remain stable, temperature < 100 Outcome: Completed/Met Date Met:  02/17/14 Goal: Discharge plan established Outcome: Completed/Met Date Met:  02/17/14 Goal: Other Phase II Outcomes/Goals Outcome: Not Applicable Date Met:  91/63/84  Problem: Phase III Progression Outcomes Goal: Activity at appropriate level-compared to baseline (UP IN CHAIR FOR HEMODIALYSIS)  Outcome: Completed/Met Date Met:  02/17/14 Goal: Afebrile, Vital Signs remain stable Outcome: Completed/Met Date Met:  02/17/14 Goal: IV Medications to PO Outcome: Completed/Met Date Met:  02/17/14 Goal: Discharge plan remains appropriate-arrangements made Outcome: Completed/Met Date Met:  02/17/14 Goal: Other Phase III Outcomes/Goals Outcome: Not Applicable Date Met:  66/59/93  Problem: Discharge Progression Outcomes Goal: Other Discharge Outcomes/Goals Outcome: Completed/Met Date Met:  02/17/14

## 2014-02-18 LAB — CULTURE, BLOOD (ROUTINE X 2)
CULTURE: NO GROWTH
Culture: NO GROWTH

## 2014-02-19 NOTE — Progress Notes (Signed)
Utilization review complete. Manasi Dishon RN CCM Case Mgmt phone [phone] 

## 2014-03-14 ENCOUNTER — Ambulatory Visit: Payer: No Typology Code available for payment source | Admitting: Podiatry

## 2014-03-26 ENCOUNTER — Ambulatory Visit (INDEPENDENT_AMBULATORY_CARE_PROVIDER_SITE_OTHER): Payer: No Typology Code available for payment source | Admitting: Podiatry

## 2014-03-26 ENCOUNTER — Encounter: Payer: Self-pay | Admitting: Podiatry

## 2014-03-26 VITALS — BP 134/99 | HR 114 | Resp 16 | Ht 67.0 in | Wt 248.0 lb

## 2014-03-26 DIAGNOSIS — L600 Ingrowing nail: Secondary | ICD-10-CM

## 2014-03-26 MED ORDER — HYDROCODONE-ACETAMINOPHEN 10-325 MG PO TABS: 1.0000 | ORAL_TABLET | Freq: Three times a day (TID) | ORAL | Status: DC | PRN

## 2014-03-26 NOTE — Patient Instructions (Signed)
Long Term Care Instructions-Post Nail Surgery  You have had your ingrown toenail and root treated with a chemical.  This chemical causes a burn that will drain and ooze like a blister.  This can drain for 6-8 weeks or longer.  It is important to keep this area clean, covered, and follow the soaking instructions dispensed at the time of your surgery.  This area will eventually dry and form a scab.  Once the scab forms you no longer need to soak or apply a dressing.  If at any time you experience an increase in pain, redness, swelling, or drainage, you should contact the office as soon as possible.ANTIBACTERIAL SOAP INSTRUCTIONS  THE DAY AFTER PROCEDURE  Please follow the instructions your doctor has marked.   Shower as usual. Before getting out, place a drop of antibacterial liquid soap (Dial) on a wet, clean washcloth.  Gently wipe washcloth over affected area.  Afterward, rinse the area with warm water.  Blot the area dry with a soft cloth and cover with antibiotic ointment (neosporin, polysporin, bacitracin) and band aid or gauze and tape  Place 3-4 drops of antibacterial liquid soap in a quart of warm tap water.  Submerge foot into water for 20 minutes.  If bandage was applied after your procedure, leave on to allow for easy lift off, then remove and continue with soak for the remaining time.  Next, blot area dry with a soft cloth and cover with a bandage.  Apply other medications as directed by your doctor, such as cortisporin otic solution (eardrops) or neosporin antibiotic ointment 

## 2014-03-26 NOTE — Progress Notes (Signed)
   Subjective:    Patient ID: Brenda [name], female    DOB: 11-20-1970, 43 y.o.   MRN: [id number]  HPI Comments: The left great toenail is thick and sore sometimes. Had the right great toenail removed years ago and want to see about getting this one done as well.      Review of Systems  All other systems reviewed and are negative.      Objective:   Physical Exam        Assessment & Plan:

## 2014-03-26 NOTE — Progress Notes (Signed)
Subjective:     Patient ID: Brenda [name], female   DOB: April 13, 1970, 43 y.o.   MRN: [id number]  HPI patient presents stating she has damaged big toenails left over right that are thick she cannot cut and they are painful when she tries to wear shoe gear or do activity. It's been present for a number of years and getting worse   Review of Systems  All other systems reviewed and are negative.      Objective:   Physical Exam  Constitutional: She is oriented to person, place, and time.  Cardiovascular: Intact distal pulses.   Musculoskeletal: Normal range of motion.  Neurological: She is oriented to person, place, and time.  Skin: Skin is warm.  Nursing note and vitals reviewed.  neurovascular status found to be intact with muscle strength adequate and range of motion of the subtalar and midtarsal joint within normal limits. 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 She wants them removed and I discussed permanent correction and discussed risk. She is willing to accept risk and today I infiltrated each hallux 60 mg Xylocaine Marcaine mixture remove the hallux nails exposed matrix and applied phenol 5 applications 30 seconds followed by alcohol lavaged each toe and then applied sterile dressing. I gave patient's instructions on soaks and reappoint

## 2014-06-04 ENCOUNTER — Inpatient Hospital Stay (HOSPITAL_COMMUNITY)
Admission: EM | Admit: 2014-06-04 | Discharge: 2014-06-06 | DRG: 759 | Disposition: A | Discharge status: Home / Self Care | Payer: No Typology Code available for payment source | Attending: Obstetrics and Gynecology | Admitting: Obstetrics and Gynecology

## 2014-06-04 ENCOUNTER — Other Ambulatory Visit: Payer: Self-pay | Admitting: Obstetrics and Gynecology

## 2014-06-04 ENCOUNTER — Emergency Department (HOSPITAL_COMMUNITY): Payer: No Typology Code available for payment source

## 2014-06-04 ENCOUNTER — Encounter (HOSPITAL_COMMUNITY): Payer: Self-pay | Admitting: Emergency Medicine

## 2014-06-04 DIAGNOSIS — Z79899 Other long term (current) drug therapy: Secondary | ICD-10-CM

## 2014-06-04 DIAGNOSIS — R1084 Generalized abdominal pain: Secondary | ICD-10-CM | POA: Diagnosis not present

## 2014-06-04 DIAGNOSIS — I10 Essential (primary) hypertension: Secondary | ICD-10-CM | POA: Diagnosis present

## 2014-06-04 DIAGNOSIS — E669 Obesity, unspecified: Secondary | ICD-10-CM | POA: Diagnosis present

## 2014-06-04 DIAGNOSIS — A419 Sepsis, unspecified organism: Secondary | ICD-10-CM

## 2014-06-04 DIAGNOSIS — N7093 Salpingitis and oophoritis, unspecified: Principal | ICD-10-CM | POA: Diagnosis present

## 2014-06-04 DIAGNOSIS — Z6836 Body mass index (BMI) 36.0-36.9, adult: Secondary | ICD-10-CM | POA: Diagnosis not present

## 2014-06-04 DIAGNOSIS — N390 Urinary tract infection, site not specified: Secondary | ICD-10-CM

## 2014-06-04 LAB — WET PREP, GENITAL
TRICH WET PREP: NONE SEEN
YEAST WET PREP: NONE SEEN

## 2014-06-04 LAB — COMPREHENSIVE METABOLIC PANEL
ALK PHOS: 88 U/L (ref 39–117)
ALT: 20 U/L (ref 0–35)
AST: 19 U/L (ref 0–37)
Albumin: 3.7 g/dL (ref 3.5–5.2)
Anion gap: 11 (ref 5–15)
BUN: 7 mg/dL (ref 6–23)
CO2: 25 mmol/L (ref 19–32)
Calcium: 8.7 mg/dL (ref 8.4–10.5)
Chloride: 98 mmol/L (ref 96–112)
Creatinine, Ser: 0.86 mg/dL (ref 0.50–1.10)
GFR calc Af Amer: >90 mL/min (ref >90)
GFR calc non Af Amer: 81 mL/min — ABNORMAL LOW (ref >90)
Glucose, Bld: 119 mg/dL — ABNORMAL HIGH (ref 70–99)
POTASSIUM: 3.5 mmol/L (ref 3.5–5.1)
SODIUM: 134 mmol/L — AB (ref 135–145)
TOTAL PROTEIN: 8.3 g/dL (ref 6.0–8.3)
Total Bilirubin: 1.8 mg/dL — ABNORMAL HIGH (ref 0.3–1.2)

## 2014-06-04 LAB — URINALYSIS, ROUTINE W REFLEX MICROSCOPIC
Bilirubin Urine: NEGATIVE
GLUCOSE, UA: NEGATIVE mg/dL
Hgb urine dipstick: NEGATIVE
Ketones, ur: 15 mg/dL — AB
Leukocytes, UA: NEGATIVE
Nitrite: POSITIVE — AB
PH: 6 (ref 5.0–8.0)
Protein, ur: NEGATIVE mg/dL
SPECIFIC GRAVITY, URINE: 1.006 (ref 1.005–1.030)
Urobilinogen, UA: 4 mg/dL — ABNORMAL HIGH (ref 0.0–1.0)

## 2014-06-04 LAB — CBC WITH DIFFERENTIAL/PLATELET
BASOS PCT: 0 % (ref 0–1)
Basophils Absolute: 0 10*3/uL (ref 0.0–0.1)
Eosinophils Absolute: 0 10*3/uL (ref 0.0–0.7)
Eosinophils Relative: 0 % (ref 0–5)
HCT: 36.4 % (ref 36.0–46.0)
Hemoglobin: 11.6 g/dL — ABNORMAL LOW (ref 12.0–15.0)
Lymphocytes Relative: 10 % — ABNORMAL LOW (ref 12–46)
Lymphs Abs: 1.7 10*3/uL (ref 0.7–4.0)
MCH: 27.8 pg (ref 26.0–34.0)
MCHC: 31.9 g/dL (ref 30.0–36.0)
MCV: 87.1 fL (ref 78.0–100.0)
MONO ABS: 1.3 10*3/uL — AB (ref 0.1–1.0)
Monocytes Relative: 8 % (ref 3–12)
Neutro Abs: 13.5 10*3/uL — ABNORMAL HIGH (ref 1.7–7.7)
Neutrophils Relative %: 82 % — ABNORMAL HIGH (ref 43–77)
Platelets: 175 10*3/uL (ref 150–400)
RBC: 4.18 MIL/uL (ref 3.87–5.11)
RDW: 13.7 % (ref 11.5–15.5)
WBC: 16.5 10*3/uL — AB (ref 4.0–10.5)

## 2014-06-04 LAB — URINE MICROSCOPIC-ADD ON

## 2014-06-04 LAB — I-STAT CG4 LACTIC ACID, ED
LACTIC ACID, VENOUS: 0.69 mmol/L (ref 0.5–2.0)
Lactic Acid, Venous: 0.71 mmol/L (ref 0.5–2.0)

## 2014-06-04 LAB — LIPASE, BLOOD: Lipase: 16 U/L (ref 11–59)

## 2014-06-04 MED ORDER — MORPHINE SULFATE 4 MG/ML IJ SOLN
4.0000 mg | INTRAMUSCULAR | Status: AC
  Administered 2014-06-04: 4 mg via INTRAVENOUS
  Filled 2014-06-04: qty 1

## 2014-06-04 MED ORDER — ONDANSETRON HCL 4 MG/2ML IJ SOLN
4.0000 mg | Freq: Four times a day (QID) | INTRAMUSCULAR | Status: DC | PRN
  Administered 2014-06-05 (×2): 4 mg via INTRAVENOUS
  Filled 2014-06-04 (×3): qty 2

## 2014-06-04 MED ORDER — SODIUM CHLORIDE 0.9 % IV SOLN
INTRAVENOUS | Status: DC
  Administered 2014-06-04: 15:00:00 via INTRAVENOUS

## 2014-06-04 MED ORDER — DEXTROSE 5 % IV SOLN
2.0000 g | Freq: Two times a day (BID) | INTRAVENOUS | Status: DC
  Administered 2014-06-04 (×2): 2 g via INTRAVENOUS
  Filled 2014-06-04 (×5): qty 2

## 2014-06-04 MED ORDER — MORPHINE SULFATE 4 MG/ML IJ SOLN
4.0000 mg | Freq: Once | INTRAMUSCULAR | Status: AC
  Administered 2014-06-04: 4 mg via INTRAVENOUS
  Filled 2014-06-04: qty 1

## 2014-06-04 MED ORDER — ONDANSETRON HCL 4 MG PO TABS
4.0000 mg | ORAL_TABLET | Freq: Four times a day (QID) | ORAL | Status: DC | PRN
  Filled 2014-06-04: qty 1

## 2014-06-04 MED ORDER — SODIUM CHLORIDE 0.9 % IV BOLUS (SEPSIS)
1000.0000 mL | Freq: Once | INTRAVENOUS | Status: AC
  Administered 2014-06-04: 1000 mL via INTRAVENOUS

## 2014-06-04 MED ORDER — DEXTROSE 5 % IV SOLN
2.0000 g | Freq: Four times a day (QID) | INTRAVENOUS | Status: DC
  Administered 2014-06-04 – 2014-06-06 (×8): 2 g via INTRAVENOUS
  Filled 2014-06-04 (×10): qty 2

## 2014-06-04 MED ORDER — OXYCODONE-ACETAMINOPHEN 5-325 MG PO TABS
1.0000 | ORAL_TABLET | ORAL | Status: DC | PRN
  Administered 2014-06-04 (×2): 1 via ORAL
  Administered 2014-06-05: 2 via ORAL
  Administered 2014-06-05: 1 via ORAL
  Filled 2014-06-04: qty 1
  Filled 2014-06-04: qty 2
  Filled 2014-06-04 (×2): qty 1

## 2014-06-04 MED ORDER — HYDROMORPHONE HCL 1 MG/ML IJ SOLN
1.0000 mg | Freq: Once | INTRAMUSCULAR | Status: AC
  Administered 2014-06-04: 1 mg via INTRAVENOUS
  Filled 2014-06-04: qty 1

## 2014-06-04 MED ORDER — IBUPROFEN 600 MG PO TABS
600.0000 mg | ORAL_TABLET | Freq: Four times a day (QID) | ORAL | Status: DC | PRN
  Administered 2014-06-04 – 2014-06-05 (×2): 600 mg via ORAL
  Filled 2014-06-04 (×2): qty 1

## 2014-06-04 MED ORDER — PRENATAL MULTIVITAMIN CH
1.0000 | ORAL_TABLET | Freq: Every day | ORAL | Status: DC
  Administered 2014-06-05 – 2014-06-06 (×2): 1 via ORAL
  Filled 2014-06-04 (×2): qty 1

## 2014-06-04 MED ORDER — IOHEXOL 300 MG/ML  SOLN
50.0000 mL | Freq: Once | INTRAMUSCULAR | Status: AC | PRN
Start: 2014-06-04 — End: 2014-06-04
  Administered 2014-06-04: 50 mL via ORAL

## 2014-06-04 MED ORDER — IOHEXOL 300 MG/ML  SOLN
100.0000 mL | Freq: Once | INTRAMUSCULAR | Status: AC | PRN
  Administered 2014-06-04: 100 mL via INTRAVENOUS

## 2014-06-04 MED ORDER — LACTATED RINGERS IV SOLN
INTRAVENOUS | Status: DC
  Administered 2014-06-04: 23:00:00 via INTRAVENOUS

## 2014-06-04 MED ORDER — DOXYCYCLINE HYCLATE 100 MG IV SOLR
100.0000 mg | Freq: Two times a day (BID) | INTRAVENOUS | Status: DC
  Administered 2014-06-04 – 2014-06-05 (×2): 100 mg via INTRAVENOUS
  Filled 2014-06-04 (×2): qty 100

## 2014-06-04 MED ORDER — DOXYCYCLINE HYCLATE 100 MG IV SOLR
100.0000 mg | Freq: Once | INTRAVENOUS | Status: AC
  Administered 2014-06-04: 100 mg via INTRAVENOUS
  Filled 2014-06-04: qty 100

## 2014-06-04 NOTE — ED Notes (Signed)
Pt's contact:  [place] (sister)---- tel# [phone]

## 2014-06-04 NOTE — ED Notes (Signed)
Pt updated about ETA of [facility]

## 2014-06-04 NOTE — ED Provider Notes (Addendum)
TIME SEEN: 10:05 AM  CHIEF COMPLAINT: Abdominal pain  HPI: Pt is a 44 y.o. female with history of hypertension, colitis in November 2015 who presents emergency department with diffuse abdominal pain that started 3 days ago. She has had dysuria, urinary frequency has been taking over-the-counter Pyridium without relief. She is status post hysterectomy and C-section. States that this feels similar to prior UTIs but also similar to colitis that she had in November. Denies any fevers, chills, nausea, vomiting or diarrhea. No recent sick contacts or travel. She has not been on antibiotics. Last bowel movement was 2 days ago. She is passing gas.  ROS: See HPI Constitutional: no fever  Eyes: no drainage  ENT: no runny nose   Cardiovascular:  no chest pain  Resp: no SOB  GI: no vomiting GU: no dysuria Integumentary: no rash  Allergy: no hives  Musculoskeletal: no leg swelling  Neurological: no slurred speech ROS otherwise negative  PAST MEDICAL HISTORY/PAST SURGICAL HISTORY:  Past Medical History  Diagnosis Date  . Endometrial polyp 2004    "polyps around the womb"  . Anemia   . Abnormal menstrual cycle     "been bleeding since 08/29/2013" (11/01/2013)  . History of blood transfusion 11/01/2013    related to abnormal menstrual cycle  . [id number](767.3)     "weekly" (11/01/2013)  . Heart murmur   . Hypertension     MEDICATIONS:  Prior to Admission medications   Medication Sig Start Date End Date Taking? Authorizing Provider  docusate sodium (COLACE) 100 MG capsule Take 100 mg by mouth 2 (two) times daily as needed for mild constipation.     Historical Provider, MD  ferrous fumarate (HEMOCYTE - 106 MG FE) 325 (106 FE) MG TABS tablet Take 1 tablet by mouth 3 (three) times daily.    Historical Provider, MD  fluconazole (DIFLUCAN) 150 MG tablet 150 mg tablet by mouth once, take after completion of full course of other antibiotics 02/17/14   Annita Brod, MD  hydrochlorothiazide  (MICROZIDE) 12.5 MG capsule Take 12.5 mg by mouth daily.    Historical Provider, MD  HYDROcodone-acetaminophen (NORCO) 10-325 MG per tablet Take 1 tablet by mouth every 8 (eight) hours as needed. 03/26/14   Wallene Huh, DPM  ibuprofen (ADVIL,MOTRIN) 600 MG tablet Take 1 tablet (600 mg total) by mouth every 6 (six) hours as needed. 12/12/13   Lavonia Drafts, MD  metoprolol tartrate (LOPRESSOR) 25 MG tablet Take 1 tablet (25 mg total) by mouth 2 (two) times daily. 02/17/14   Annita Brod, MD  oxyCODONE-acetaminophen (PERCOCET/ROXICET) 5-325 MG per tablet Take 1-2 tablets by mouth every 6 (six) hours as needed. 12/12/13   Lavonia Drafts, MD  simethicone (MYLICON) 419 MG chewable tablet Chew 125 mg by mouth every 6 (six) hours as needed for flatulence.    Historical Provider, MD    ALLERGIES:  No Known Allergies  SOCIAL HISTORY:  History  Substance Use Topics  . Smoking status: Never Smoker   . Smokeless tobacco: Never Used  . Alcohol Use: No    FAMILY HISTORY: Family History  Problem Relation Age of Onset  . Hypertension Maternal Grandmother     EXAM: BP 139/93 mmHg  Pulse 112  Temp(Src) 98.6 F (37 C) (Oral)  Resp 16  SpO2 96%  LMP 12/01/2013 (Approximate) CONSTITUTIONAL: Alert and oriented and responds appropriately to questions. Patient is nontoxic but does appear uncomfortable, not in distress HEAD: Normocephalic EYES: Conjunctivae clear, PERRL ENT: normal nose; no rhinorrhea; moist  mucous membranes; pharynx without lesions noted NECK: Supple, no meningismus, no LAD  CARD: Regular and tachycardic, S1 and S2 appreciated; no murmurs, no clicks, no rubs, no gallops RESP: Normal chest excursion without splinting or tachypnea; breath sounds clear and equal bilaterally; no wheezes, no rhonchi, no rales ABD/GI: Normal bowel sounds; non-distended; soft, diffusely tender throughout the abdomen with full enteric guarding, no rebound, no peritoneal signs GU:  Normal  external genitalia, patient has left adnexal tenderness without fullness, large amount of yellow foul-smelling purulent drainage coming from the vagina, no vaginal bleeding, no right adnexal tenderness or fullness; pt has vaginal cuff BACK:  The back appears normal and is non-tender to palpation, there is no CVA tenderness EXT: Normal ROM in all joints; non-tender to palpation; no edema; normal capillary refill; no cyanosis    SKIN: Normal color for age and race; warm NEURO: Moves all extremities equally PSYCH: The patient's mood and manner are appropriate. Grooming and personal hygiene are appropriate.  MEDICAL DECISION MAKING: Patient here with diffuse abdominal pain. She is tachycardic but otherwise hemodynamically stable, afebrile. She does appear uncomfortable on exam and has diffuse tenderness with guarding. Will obtain abdominal labs, urinalysis, CT of her abdomen pelvis. We'll give IV fluids, pain medication.  ED PROGRESS: Patient's labs show leukocytosis of 16.5. Urine does show nitrites and many bacteria. Culture pending.  CT scan shows a complex fluid density in the left adnexal with internal air and surrounding inflammatory changes indicative of a tubo-ovarian abscess. Air in the vagina also suggest the possibility of a fistula.   Patient reports she is sexually active with one female partner who she has been in a relationship with for 2 years. Denies a prior history of STDs. Denies that she has noticed vaginal bleeding or discharge. Had a hysterectomy in September 2015. Her OB/GYN is Dr. Ihor Dow.  On exam patient has a copious amount of foul-smelling purulent drainage coming from the vagina with no vaginal bleeding. She has left adnexal tenderness without appreciable fullness. No cervical motion tenderness. We'll discuss with OB/GYN on call.  D/w Dr. Elly Modena with OB/GYN.  Given patient is tachycardic and has a leukocytosis she meets sepsis criteria. Will give patient Cefotan and  doxycycline per OB/GYN recommendations and [facility]    CRITICAL CARE Performed by: Nyra Jabs   Total critical care time: 40 minutes  Critical care time was exclusive of separately billable procedures and treating other patients.  Critical care was necessary to treat or prevent imminent or life-threatening deterioration.  Critical care was time spent personally by me on the following activities: development of treatment plan with patient and/or surrogate as well as nursing, discussions with consultants, evaluation of patient's response to treatment, examination of patient, obtaining history from patient or surrogate, ordering and performing treatments and interventions, ordering and review of laboratory studies, ordering and review of radiographic studies, pulse oximetry and re-evaluation of patient's condition.        [place], DO 06/04/14 [place], DO 06/04/14 [id number]

## 2014-06-04 NOTE — ED Notes (Signed)
Secretary attempting to find where patient needs to go at [facility]. Pt updated

## 2014-06-04 NOTE — ED Notes (Signed)
[facility] called and reports an ETA of 1 1/2 hours.

## 2014-06-04 NOTE — ED Notes (Signed)
Report given to [place] on [facility]rd floor [facility].

## 2014-06-04 NOTE — ED Notes (Signed)
This RN called MD Roland Rack for bed assignment. MD reports she will call [facility] and have one put in.

## 2014-06-04 NOTE — ED Notes (Signed)
[facility] at bedside 

## 2014-06-04 NOTE — ED Notes (Signed)
PT in CT.

## 2014-06-04 NOTE — ED Notes (Signed)
Pt. Is aware that we need a urine specimen, at this time is unable to go.

## 2014-06-04 NOTE — ED Notes (Signed)
Pt c/o low cramping, sharp abdominal pain, urinary urgency, has been self treating with OTC UTI medications.

## 2014-06-04 NOTE — ED Notes (Addendum)
Ice Chips provided.

## 2014-06-04 NOTE — H&P (Signed)
Brenda [name] is an 44 y.o. G10P2003 female admitted for tubo-ovarian abscess.  Chief Complaint: abdominal pain HPI: Brenda [name] is a 44 y.o. G2P2003 who initially presented to the Emergency Department for evaluation of abdominal pain. She was in her usual state of health until three days ago when she developed diffuse abdominal pain. She reports mild dysuria and urinary frequency for which she was taking Pyridium without relief. Denies fevers, chills, chest pain, shortness of breath, nausea, vomiting, changes in bowels or other concerning symptoms. She is status post hysterectomy in 2015. Denies sick contacts.   Past Medical History  Diagnosis Date  . Endometrial polyp 2004    "polyps around the womb"  . Anemia   . Abnormal menstrual cycle     "been bleeding since 08/29/2013" (11/01/2013)  . History of blood transfusion 11/01/2013    related to abnormal menstrual cycle  . YQIHKVQQ(595.6)     "weekly" (11/01/2013)  . Heart murmur   . Hypertension     Past Surgical History  Procedure Laterality Date  . Cesarean section  1994  . Dilation and curettage of uterus  2004    for endometrial polyp  . Tubal ligation  1994  . Robotic assisted total hysterectomy Bilateral 12/12/2013    Procedure: ROBOTIC ASSISTED TOTAL HYSTERECTOMY WITH BILATERAL SALPINGECTOMY;  Surgeon: Lavonia Drafts, MD;  Location: [facility];  Service: Gynecology;  Laterality: Bilateral;  . Cystoscopy N/A 12/12/2013    Procedure: CYSTOSCOPY;  Surgeon: Lavonia Drafts, MD;  Location: [facility];  Service: Gynecology;  Laterality: N/A;    Family History  Problem Relation Age of Onset  . Hypertension Maternal Grandmother    Social History:  reports that she has never smoked. She has never used smokeless tobacco. She reports that she does not drink alcohol or use illicit drugs.  Allergies: No Known Allergies  No current facility-administered medications on file prior to encounter.   Current Outpatient Prescriptions  on File Prior to Encounter  Medication Sig Dispense Refill  . docusate sodium (COLACE) 100 MG capsule Take 100 mg by mouth 2 (two) times daily as needed for mild constipation.     . ferrous fumarate (HEMOCYTE - 106 MG FE) 325 (106 FE) MG TABS tablet Take 1 tablet by mouth 3 (three) times daily.    . fluconazole (DIFLUCAN) 150 MG tablet 150 mg tablet by mouth once, take after completion of full course of other antibiotics (Patient not taking: Reported on 06/04/2014) 1 tablet 0  . HYDROcodone-acetaminophen (NORCO) 10-325 MG per tablet Take 1 tablet by mouth every 8 (eight) hours as needed. (Patient not taking: Reported on 06/04/2014) 30 tablet 0  . ibuprofen (ADVIL,MOTRIN) 600 MG tablet Take 1 tablet (600 mg total) by mouth every 6 (six) hours as needed. (Patient not taking: Reported on 06/04/2014) 30 tablet 1  . metoprolol tartrate (LOPRESSOR) 25 MG tablet Take 1 tablet (25 mg total) by mouth 2 (two) times daily. (Patient not taking: Reported on 06/04/2014) 60 tablet 0  . oxyCODONE-acetaminophen (PERCOCET/ROXICET) 5-325 MG per tablet Take 1-2 tablets by mouth every 6 (six) hours as needed. (Patient not taking: Reported on 06/04/2014) 30 tablet 0    A comprehensive review of systems was negative except as listed in HPI.   Blood pressure 146/94, pulse 113, temperature 99.1 F (37.3 C), temperature source Oral, resp. rate 18, height 5\' 7"  (1.702 m), weight 235 lb (106.595 kg), last menstrual period 12/01/2013, SpO2 97 %. General appearance: alert, cooperative, appears stated age and no distress Lungs: no  respiratory distress, normal effort Heart: regular rate and rhythm Abdomen: nondistended, diffusely tender to palpation throughout, no rebound or guarding   Lab Results  Component Value Date   WBC 16.5* 06/04/2014   HGB 11.6* 06/04/2014   HCT 36.4 06/04/2014   MCV 87.1 06/04/2014   PLT 175 06/04/2014   Lab Results  Component Value Date   PREGTESTUR NEGATIVE 12/12/2013   CT  Abdomen/Pelvis IMPRESSION: 1. Complex fluid density structure in the left adnexa with internal air and surrounding inflammatory changes, most indicative of a tubo-ovarian abscess. Air in the vagina suggest the possibility of a fistula. 2. Persistent mild bladder wall thickening. 3. Minimal dilatation of the right ureter and perisigmoid inflammatory changes are likely secondary to #1.  Assessment/Plan Patient Active Problem List   Diagnosis Date Noted  . Tubo-ovarian abscess 06/04/2014  . TOA (tubo-ovarian abscess) 06/04/2014  . Colitis, infectious 02/16/2014  . CAP (community acquired pneumonia) 02/16/2014  . Obesity (BMI 30-39.9) 02/14/2014  . Essential hypertension 02/14/2014  . Hepatic steatosis 02/14/2014  . UTI (lower urinary tract infection) 02/12/2014  . Tachycardia 02/12/2014  . Abdominal pain   . Generalized abdominal pain   . Pyrexia   . Hypoxia   . Postoperative state 12/12/2013  . Symptomatic anemia 11/01/2013  . Abnormal uterine bleeding 09/06/2013   Brenda [name] is a 44 y.o. G2P2003 with abdominal pain secondary to tubo-ovarian abscess. She is nontoxic appearing. She is afebrile. 2/4 SIRS criteria (leukocytosis, tachycardiac). Otherwise vital signs stable.   #Tubo-Ovarian Abscess - IV ABX - NPO with MIVF - [place] Pelvix complete - Percocet prn pain - AM CBC  Shelbie Hutching 06/04/2014, 7:35 PM

## 2014-06-05 ENCOUNTER — Inpatient Hospital Stay (HOSPITAL_COMMUNITY): Payer: No Typology Code available for payment source

## 2014-06-05 DIAGNOSIS — N7093 Salpingitis and oophoritis, unspecified: Principal | ICD-10-CM

## 2014-06-05 LAB — URINE CULTURE
Colony Count: NO GROWTH
Culture: NO GROWTH

## 2014-06-05 LAB — GC/CHLAMYDIA PROBE AMP (~~LOC~~) NOT AT ARMC
Chlamydia: NEGATIVE
Neisseria Gonorrhea: NEGATIVE

## 2014-06-05 LAB — CBC
HEMATOCRIT: 31.2 % — AB (ref 36.0–46.0)
HEMOGLOBIN: 10 g/dL — AB (ref 12.0–15.0)
MCH: 27.8 pg (ref 26.0–34.0)
MCHC: 32.1 g/dL (ref 30.0–36.0)
MCV: 86.7 fL (ref 78.0–100.0)
Platelets: 155 10*3/uL (ref 150–400)
RBC: 3.6 MIL/uL — AB (ref 3.87–5.11)
RDW: 13.8 % (ref 11.5–15.5)
WBC: 10.7 10*3/uL — ABNORMAL HIGH (ref 4.0–10.5)

## 2014-06-05 MED ORDER — DOXYCYCLINE HYCLATE 100 MG PO TABS
100.0000 mg | ORAL_TABLET | Freq: Two times a day (BID) | ORAL | Status: DC
  Administered 2014-06-05 – 2014-06-06 (×2): 100 mg via ORAL
  Filled 2014-06-05 (×4): qty 1

## 2014-06-05 NOTE — Progress Notes (Signed)
Subjective: Patient reports feeling much better. Her pain has completely resolved.    Objective: I have reviewed patient's vital signs and labs.  General: alert, cooperative and no distress GI: soft, non-tender; bowel sounds normal; no masses,  no organomegaly Extremities: extremities normal, atraumatic, no cyanosis or edema   Assessment/Plan: -Continue Iv antibiotics - Pelvic ultrasound to assess the abscess seen on CT scan - may consider discharge home following 24 hours of IV antibiotics pending ultrasound   LOS: 1 day    Brenda [name] 06/05/2014, 6:44 AM

## 2014-06-06 ENCOUNTER — Encounter: Payer: Self-pay | Admitting: Obstetrics & Gynecology

## 2014-06-06 MED ORDER — DOXYCYCLINE HYCLATE 100 MG PO TABS: 100.0000 mg | ORAL_TABLET | Freq: Two times a day (BID) | ORAL | Status: DC

## 2014-06-06 MED ORDER — DOCUSATE SODIUM 100 MG PO CAPS: 100.0000 mg | ORAL_CAPSULE | Freq: Two times a day (BID) | ORAL | Status: DC | PRN

## 2014-06-06 MED ORDER — OXYCODONE-ACETAMINOPHEN 5-325 MG PO TABS: 1.0000 | ORAL_TABLET | Freq: Four times a day (QID) | ORAL | Status: DC | PRN

## 2014-06-06 MED ORDER — METRONIDAZOLE 500 MG PO TABS
500.0000 mg | ORAL_TABLET | Freq: Two times a day (BID) | ORAL | Status: AC
Start: 2014-06-06 — End: 2014-06-13

## 2014-06-06 MED ORDER — IBUPROFEN 600 MG PO TABS: 600.0000 mg | ORAL_TABLET | Freq: Four times a day (QID) | ORAL | Status: DC | PRN

## 2014-06-06 NOTE — Discharge Instructions (Signed)

## 2014-06-06 NOTE — Discharge Summary (Signed)
Physician Discharge Summary  Patient ID: Satina Jerrell MRN: [id number] DOB/AGE: 09-Sep-1970 43 y.o.  Admit date: 06/04/2014 Discharge date: 06/06/2014  Admission Diagnoses:  Discharge Diagnoses:  Active Problems:   Tubo-ovarian abscess   TOA (tubo-ovarian abscess)  Discharged Condition: stable  Hospital Course: Patient was admitted with increased pain and leukocytosis; never had a fever.  Imaging showed 7 cm left TOA.  She was treated with IV Cefotetan and Doxycycline for 2 days.  Her pain improved and she was discharged to home on oral Doxycyline and Metronidazole, and oral analgesia.  She will follow up in clinic on 06/21/14.   Significant Diagnostic Studies:  CBC Latest Ref Rng 06/05/2014 06/04/2014 02/15/2014  WBC 4.0 - 10.5 K/uL 10.7(H) 16.5(H) 8.4  Hemoglobin 12.0 - 15.0 g/dL 10.0(L) 11.6(L) 9.5(L)  Hematocrit 36.0 - 46.0 % 31.2(L) 36.4 30.0(L)  Platelets 150 - 400 K/uL 155 175 223    06/05/2014   CLINICAL DATA:  Complex cystic left adnexal mass on CT, worrisome for TOA, status post hysterectomy and bilateral salpingectomy  EXAM: TRANSABDOMINAL AND TRANSVAGINAL ULTRASOUND OF PELVIS  TECHNIQUE: Both transabdominal and transvaginal ultrasound examinations of the pelvis were performed. Transabdominal technique was performed for global imaging of the pelvis including uterus, ovaries, adnexal regions, and pelvic cul-de-sac. It was necessary to proceed with endovaginal exam following the transabdominal exam to visualize the left adnexa.  COMPARISON:  CT abdomen pelvis dated 06/04/2014  FINDINGS: Uterus  Surgically absent.  Right ovary  Not discretely visualized.  This appeared normal on CT.  Left ovary  7.1 x 5.1 x 5.7 cm complex cystic left adnexal mass, worrisome for ovarian abscess. Color Doppler flow is present within probable normal ovarian tissue along the periphery of the lesion. Dirty shadowing is present, related to gas within the lesion, better visualized on CT.  Other findings  No free  fluid.  IMPRESSION: 7.1 x 5.1 x 5.7 cm complex cystic left adnexal mass with associated CT, worrisome for ovarian abscess.  Possible fistulous communication between the vaginal cuff and the left adnexal mass is better demonstrated on prior CT.  Right ovary is not discretely visualized on ultrasound but appeared normal on prior CT.   Electronically Signed   By: Julian Hy M.D.   On: 06/05/2014 12:36   06/04/2014   CLINICAL DATA:  Hervey Ard low abdominal cramping with urinary urgency since 06/01/2014. Leukocytosis.  EXAM: CT ABDOMEN AND PELVIS WITH CONTRAST  TECHNIQUE: Multidetector CT imaging of the abdomen and pelvis was performed using the standard protocol following bolus administration of intravenous contrast.  CONTRAST:  134mL OMNIPAQUE IOHEXOL 300 MG/ML  SOLN  COMPARISON:  02/15/2014 and 02/11/2014.  FINDINGS: Lower chest: Lung bases show no acute findings. Heart size normal. Pre pericardiac lymph nodes are subcentimeter in short axis size. No pericardial or pleural effusion.  Hepatobiliary: Liver and gallbladder are are unremarkable. No biliary ductal dilatation.  Pancreas: Negative.  Spleen: Negative.  Adrenals/Urinary Tract: Adrenal glands and kidneys are unremarkable. Mild dilatation of the right ureter without an obstructing stone. Left ureter is decompressed. Bladder wall appears slightly thickened.  Stomach/Bowel: Stomach, small bowel and appendix are unremarkable. Very mild pericolonic inflammatory haziness adjacent to the sigmoid colon (example series 2, image 59). Colon is otherwise unremarkable.  Vascular/Lymphatic: Vascular structures are unremarkable. Left iliac chain lymph nodes measure up to 8 mm. Right iliac chain lymph nodes measure up to 10 mm, unchanged. Scattered perirectal lymph nodes are subcentimeter in size and stable. Abdominal retroperitoneal lymph nodes are subcentimeter in short axis  size.  Reproductive: Hysterectomy. There is a complex fluid density structure in the left adnexa,  measuring 4.6 x 7.9 cm, in the expected location of the left ovary. Air is seen within (images 68 and 69). Additionally, air is seen within the vagina. Right ovary is unremarkable.  Other: Mild presacral edema. No free fluid. Mesenteries and peritoneum are otherwise unremarkable.  Musculoskeletal: No worrisome lytic or sclerotic lesions.  IMPRESSION: 1. Complex fluid density structure in the left adnexa with internal air and surrounding inflammatory changes, most indicative of a tubo-ovarian abscess. Air in the vagina suggest the possibility of a fistula. 2. Persistent mild bladder wall thickening. 3. Minimal dilatation of the right ureter and perisigmoid inflammatory changes are likely secondary to #1.   Electronically Signed   By: Lorin Picket M.D.   On: 06/04/2014 12:15   Discharge Exam: Blood pressure 130/78, pulse 86, temperature 98.3 F (36.8 C), temperature source Oral, resp. rate 18, height 5\' 7"  (1.702 m), weight 235 lb (106.595 kg), last menstrual period 12/01/2013, SpO2 100 %. General appearance: alert, cooperative and no distress GI: soft, mild LLQ tenderness, bowel sounds normal; no masses,  no organomegaly Pelvic: deferred Extremities: extremities normal, atraumatic, no cyanosis or edema and Homans sign is negative, no sign of DVT  Disposition: 01-Home or Self Care     Medication List    TAKE these medications        docusate sodium 100 MG capsule  Commonly known as:  COLACE  Take 1 capsule (100 mg total) by mouth 2 (two) times daily as needed for mild constipation.     doxycycline 100 MG tablet  Commonly known as:  VIBRA-TABS  Take 1 tablet (100 mg total) by mouth every 12 (twelve) hours.     ferrous fumarate 325 (106 FE) MG Tabs tablet  Commonly known as:  HEMOCYTE - 106 mg FE  Take 1 tablet by mouth 3 (three) times daily.     fluconazole 150 MG tablet  Commonly known as:  DIFLUCAN  150 mg tablet by mouth once, take after completion of full course of other  antibiotics     HYDROcodone-acetaminophen 10-325 MG per tablet  Commonly known as:  NORCO  Take 1 tablet by mouth every 8 (eight) hours as needed.     ibuprofen 600 MG tablet  Commonly known as:  ADVIL,MOTRIN  Take 1 tablet (600 mg total) by mouth every 6 (six) hours as needed for moderate pain.     lisinopril-hydrochlorothiazide 20-12.5 MG per tablet  Commonly known as:  PRINZIDE,ZESTORETIC  Take 1 tablet by mouth daily.     metoprolol tartrate 25 MG tablet  Commonly known as:  LOPRESSOR  Take 1 tablet (25 mg total) by mouth 2 (two) times daily.     metroNIDAZOLE 500 MG tablet  Commonly known as:  FLAGYL  Take 1 tablet (500 mg total) by mouth 2 (two) times daily.     multivitamin with minerals Tabs tablet  Take 1 tablet by mouth daily.     oxyCODONE-acetaminophen 5-325 MG per tablet  Commonly known as:  PERCOCET/ROXICET  Take 1-2 tablets by mouth every 6 (six) hours as needed for moderate pain or severe pain.           Follow-up Information    Follow up with Lavonia Drafts, MD On 06/21/2014.   Specialty:  Obstetrics and Gynecology   Why:  2:45 pm for follow up   Contact information:   [place] [address] [phone]  SignedVerita Schneiders A 06/06/2014, 9:42 AM

## 2014-06-06 NOTE — Progress Notes (Signed)
Patient given discharge instructions and follow up care.  Patient verbalized understanding.  Patient in no distress.  Patient discharged independently to home.    Ames Coupe RN

## 2014-06-10 LAB — CULTURE, BLOOD (ROUTINE X 2)
Culture: NO GROWTH
Culture: NO GROWTH

## 2014-06-21 ENCOUNTER — Ambulatory Visit (INDEPENDENT_AMBULATORY_CARE_PROVIDER_SITE_OTHER): Payer: No Typology Code available for payment source | Admitting: Obstetrics & Gynecology

## 2014-06-21 ENCOUNTER — Encounter: Payer: Self-pay | Admitting: Obstetrics & Gynecology

## 2014-06-21 VITALS — BP 147/92 | HR 90 | Temp 98.0°F | Wt 248.5 lb

## 2014-06-21 DIAGNOSIS — N7093 Salpingitis and oophoritis, unspecified: Secondary | ICD-10-CM

## 2014-06-21 NOTE — Progress Notes (Signed)
Subjective:     Patient ID: Brenda [name], female   DOB: January 21, 1971, 44 y.o.   MRN: [id number]  HPI  Pt reports that in November pt was sent from [facility] to [facility] for fever, chills, abd pain (same as recent sx) was admitted for 1 week for colitis. Pt reports recurrence of sx in Jan.  She was s/p an infxn in Feb- Mar 2nd and was dx'd with TOA.  Pt denies vaginal discharge or bleeding. She denies fever or chills.  She reports that when the sx begin they start with a sensation of having gas then begin fairly abruptly.  She reprots that the sx were the same each time even though the dx was different.    Review of Systems     Objective:   Physical Exam BP 147/92 mmHg  Pulse 90  Temp(Src) 98 F (36.7 C) (Oral)  Wt 248 lb 8 oz (112.719 kg)  LMP 12/01/2013 (Approximate)  Pt in NAD Abd; obese, NT, ND GU: EGBUS: no lesions Vagina: no blood in vault; small area of granulation tissue seen on left side of cuff.  Cuff well healed. No opening seen.  No abnormal discharge noted.   Adnexa: no masses; non tender       02/11/2014 CLINICAL DATA: Pt had abdominal hysterectomy on Sep 8th 2015 without any complications. Pt started yesterday with lower abd pain, took 1200mg  ibuprofen for pain yesterday with minimal relief. Pressure when she voids, noticed a strong odor with urine and a dark color. Fever.  EXAM: CT ABDOMEN AND PELVIS WITH CONTRAST  TECHNIQUE: Multidetector CT imaging of the abdomen and pelvis was performed using the standard protocol following bolus administration of intravenous contrast.  CONTRAST: 143mL OMNIPAQUE IOHEXOL 300 MG/ML SOLN  COMPARISON: None.  FINDINGS: Bladder is only mildly distended. The wall appears thickened with some hazy stranding in the perivesicular fat. Additional fat stranding is seen adjacent to the ovaries and adnexa. No pelvic fluid collection is seen to suggest an abscess. Uterus is surgically absent.  There are air-fluid levels within the  small bowel and colon. No bowel wall thickening. No bowel dilation is seen to suggest obstruction. This suggests a mild diffuse adynamic ileus.  Lungs show mild basilar atelectasis. Heart is normal in size.  Fatty infiltration of the liver. No liver mass or focal lesion.  Spleen, gallbladder, pancreas, adrenal glands: Normal. Normal kidneys and ureters.  No pathologically enlarged lymph nodes. No ascites.  Mild loss of disc height at L4-L5. Bony structures otherwise unremarkable.  IMPRESSION: 1. Bladder wall thickening with adjacent fat inflammation. Findings are consistent with cystitis. 2. No abnormal fluid collections. No evidence of a postoperative abscess. 3. Air-fluid levels within the colon and small bowel with no evidence of bowel wall thickening or inflammation. Findings are consistent with a mild adynamic ileus. 4. Hepatic steatosis.  02/15/2014 CLINICAL DATA: Lower abdominal pressure and pain with fever 5 days. Nodular this morning.  EXAM: CT ABDOMEN AND PELVIS WITH CONTRAST  TECHNIQUE: Multidetector CT imaging of the abdomen and pelvis was performed using the standard protocol following bolus administration of intravenous contrast.  CONTRAST: 187mL OMNIPAQUE IOHEXOL 300 MG/ML SOLN  COMPARISON: 02/11/2014  FINDINGS: Lung bases demonstrate patchy airspace opacification right worse than left which may be due to infection and less likely atelectasis.  Abdominal images demonstrate mild gallbladder dilatation. The spleen, liver, pancreas and adrenal glands are normal. Kidneys are normal in size without hydronephrosis or nephrolithiasis. The appendix is within normal. Abdominal aorta is within normal. No  evidence of bowel obstruction. Contrast is present throughout the colon.  There is mild stranding of the mesenteric fat over the lower abdomen and pelvis just above the bladder as these changes are slightly worse. There is subtle wall  thickening of the rectosigmoid colon. There is mild increased free fluid over the left paracolic gutter. There is no free peritoneal air. There is continued mild bladder wall thickening unchanged. No evidence of focal fluid collection to suggest abscess. Remainder of the exam is unchanged.  IMPRESSION: Slight worsening free fluid and stranding of the mesentery over the lower abdomen/ pelvis just above the bladder. No definite abscess. No free peritoneal air. There is minimal wall thickening of the rectosigmoid colon as cannot exclude an acute colitis. There is continued minimal bladder wall thickening as cystitis is less likely.  Patchy airspace process over the lung bases right worse than left likely due to pneumonia.  06/04/2014 CLINICAL DATA: Hervey Ard low abdominal cramping with urinary urgency since 06/01/2014. Leukocytosis.  EXAM: CT ABDOMEN AND PELVIS WITH CONTRAST  TECHNIQUE: Multidetector CT imaging of the abdomen and pelvis was performed using the standard protocol following bolus administration of intravenous contrast.  CONTRAST: 112mL OMNIPAQUE IOHEXOL 300 MG/ML SOLN  COMPARISON: 02/15/2014 and 02/11/2014.  FINDINGS: Lower chest: Lung bases show no acute findings. Heart size normal. Pre pericardiac lymph nodes are subcentimeter in short axis size. No pericardial or pleural effusion.  Hepatobiliary: Liver and gallbladder are are unremarkable. No biliary ductal dilatation.  Pancreas: Negative.  Spleen: Negative.  Adrenals/Urinary Tract: Adrenal glands and kidneys are unremarkable. Mild dilatation of the right ureter without an obstructing stone. Left ureter is decompressed. Bladder wall appears slightly thickened.  Stomach/Bowel: Stomach, small bowel and appendix are unremarkable. Very mild pericolonic inflammatory haziness adjacent to the sigmoid colon (example series 2, image 59). Colon is otherwise unremarkable.  Vascular/Lymphatic:  Vascular structures are unremarkable. Left iliac chain lymph nodes measure up to 8 mm. Right iliac chain lymph nodes measure up to 10 mm, unchanged. Scattered perirectal lymph nodes are subcentimeter in size and stable. Abdominal retroperitoneal lymph nodes are subcentimeter in short axis size.  Reproductive: Hysterectomy. There is a complex fluid density structure in the left adnexa, measuring 4.6 x 7.9 cm, in the expected location of the left ovary. Air is seen within (images 68 and 69). Additionally, air is seen within the vagina. Right ovary is unremarkable.  Other: Mild presacral edema. No free fluid. Mesenteries and peritoneum are otherwise unremarkable.  Musculoskeletal: No worrisome lytic or sclerotic lesions.  IMPRESSION: 1. Complex fluid density structure in the left adnexa with internal air and surrounding inflammatory changes, most indicative of a tubo-ovarian abscess. Air in the vagina suggest the possibility of a fistula. 2. Persistent mild bladder wall thickening. 3. Minimal dilatation of the right ureter and perisigmoid inflammatory changes are likely secondary to #1.  06/05/2014 CLINICAL DATA: Complex cystic left adnexal mass on CT, worrisome for TOA, status post hysterectomy and bilateral salpingectomy  EXAM: TRANSABDOMINAL AND TRANSVAGINAL ULTRASOUND OF PELVIS  TECHNIQUE: Both transabdominal and transvaginal ultrasound examinations of the pelvis were performed. Transabdominal technique was performed for global imaging of the pelvis including uterus, ovaries, adnexal regions, and pelvic cul-de-sac. It was necessary to proceed with endovaginal exam following the transabdominal exam to visualize the left adnexa.  COMPARISON: CT abdomen pelvis dated 06/04/2014  FINDINGS: Uterus  Surgically absent.  Right ovary  Not discretely visualized. This appeared normal on CT.  Left ovary  7.1 x 5.1 x 5.7 cm complex cystic left  adnexal mass,  worrisome for ovarian abscess. Color Doppler flow is present within probable normal ovarian tissue along the periphery of the lesion. Dirty shadowing is present, related to gas within the lesion, better visualized on CT.  Other findings  No free fluid.  IMPRESSION: 7.1 x 5.1 x 5.7 cm complex cystic left adnexal mass with associated CT, worrisome for ovarian abscess.  Possible fistulous communication between the vaginal cuff and the left adnexal mass is better demonstrated on prior CT.  Right ovary is not discretely visualized on ultrasound but appeared normal on prior CT.       Assessment:     Pelvic infection x 2 after RATH.  All images reviewed. Pt asymptomatic now but, still on atbx. Need to complete atbx.  I am concerned about the development of a TOA post op.  Need to r/o fistula.  Discussed the possibility of fistula with pt.   Plan:     Complete doxycycline Pelvic CT w/ contrast F/u in 3 weeks or sooner prn.  Pt given s/sx of infxn to f/u sooner than 3 week

## 2014-06-21 NOTE — Patient Instructions (Signed)
Pelvic Inflammatory Disease °Pelvic inflammatory disease (PID) refers to an infection in some or all of the female organs. The infection can be in the uterus, ovaries, fallopian tubes, or the surrounding tissues in the pelvis. PID can cause abdominal or pelvic pain that comes on suddenly (acute pelvic pain). PID is a serious infection because it can lead to lasting (chronic) pelvic pain or the inability to have children (infertile).  °CAUSES  °The infection is often caused by the normal bacteria found in the vaginal tissues. PID may also be caused by an infection that is spread during sexual contact. PID can also occur following:  °· The birth of a baby.   °· A miscarriage.   °· An abortion.   °· Major pelvic surgery.   °· The use of an intrauterine device (IUD).   °· A sexual assault.   °RISK FACTORS °Certain factors can put a person at higher risk for PID, such as: °· Being younger than 25 years. °· Being sexually active at a young age. °· Using nonbarrier contraception. °· Having multiple sexual partners. °· Having sex with someone who has symptoms of a genital infection. °· Using oral contraception. °Other times, certain behaviors can increase the possibility of getting PID, such as: °· Having sex during your period. °· Using a vaginal douche. °· Having an intrauterine device (IUD) in place. °SYMPTOMS  °· Abdominal or pelvic pain.   °· Fever.   °· Chills.   °· Abnormal vaginal discharge. °· Abnormal uterine bleeding.   °· Unusual pain shortly after finishing your period. °DIAGNOSIS  °Your caregiver will choose some of the following methods to make a diagnosis, such as:  °· Performing a physical exam and history. A pelvic exam typically reveals a very tender uterus and surrounding pelvis.   °· Ordering laboratory tests including a pregnancy test, blood tests, and urine test.  °· Ordering cultures of the vagina and cervix to check for a sexually transmitted infection (STI). °· Performing an ultrasound.    °· Performing a laparoscopic procedure to look inside the pelvis.   °TREATMENT  °· Antibiotic medicines may be prescribed and taken by mouth.   °· Sexual partners may be treated when the infection is caused by a sexually transmitted disease (STD).   °· Hospitalization may be needed to give antibiotics intravenously. °· Surgery may be needed, but this is rare. °It may take weeks until you are completely well. If you are diagnosed with PID, you should also be checked for human immunodeficiency virus (HIV).   °HOME CARE INSTRUCTIONS  °· If given, take your antibiotics as directed. Finish the medicine even if you start to feel better.   °· Only take over-the-counter or prescription medicines for pain, discomfort, or fever as directed by your caregiver.   °· Do not have sexual intercourse until treatment is completed or as directed by your caregiver. If PID is confirmed, your recent sexual partner(s) will need treatment.   °· Keep your follow-up appointments. °SEEK MEDICAL CARE IF:  °· You have increased or abnormal vaginal discharge.   °· You need prescription medicine for your pain.   °· You vomit.   °· You cannot take your medicines.   °· Your partner has an STD.   °SEEK IMMEDIATE MEDICAL CARE IF:  °· You have a fever.   °· You have increased abdominal or pelvic pain.   °· You have chills.   °· You have pain when you urinate.   °· You are not better after 72 hours following treatment.   °MAKE SURE YOU:  °· Understand these instructions. °· Will watch your condition. °· Will get help right away if you are not doing well or get worse. °  Document Released: 03/23/2005 Document Revised: 07/18/2012 Document Reviewed: 03/19/2011 °[employer]® Patient Information ©2015 [employer]. This information is not intended to replace advice given to you by your health care provider. Make sure you discuss any questions you have with your health care provider. ° °

## 2014-06-28 ENCOUNTER — Ambulatory Visit (HOSPITAL_COMMUNITY)
Admission: RE | Admit: 2014-06-28 | Discharge: 2014-06-28 | Disposition: A | Discharge status: Home / Self Care | Payer: No Typology Code available for payment source | Source: Ambulatory Visit | Attending: Obstetrics & Gynecology | Admitting: Obstetrics & Gynecology

## 2014-06-28 ENCOUNTER — Encounter (HOSPITAL_COMMUNITY): Payer: Self-pay | Admitting: Certified Registered Nurse Anesthetist

## 2014-06-28 ENCOUNTER — Telehealth: Payer: Self-pay | Admitting: Obstetrics & Gynecology

## 2014-06-28 DIAGNOSIS — N7093 Salpingitis and oophoritis, unspecified: Secondary | ICD-10-CM | POA: Diagnosis present

## 2014-06-28 MED ORDER — IOHEXOL 300 MG/ML  SOLN
100.0000 mL | Freq: Once | INTRAMUSCULAR | Status: AC | PRN
  Administered 2014-06-28: 100 mL via INTRAVENOUS

## 2014-06-28 NOTE — Telephone Encounter (Signed)
TC to review the results of pts CT of pelvis.  She reports that she is having no sx at present and she only has one antibiotic dose left.  She denies vaginal discharge.    She was instructed to f/u if her sx returned or if new problems developed.  All of her questions were answered.   Brenda [name] L. Harraway-Carrera, M.D., Cherlynn June

## 2014-06-28 NOTE — Consult Note (Signed)
20g PIV placed for CT scan in left AC using 1% Lidocaine with 0.7mEq Bicarb x1 attempt by CRNA.  Pt. Tolerated procedure well.  Ladora Daniel CRNA

## 2014-07-16 ENCOUNTER — Ambulatory Visit: Payer: No Typology Code available for payment source | Admitting: Obstetrics & Gynecology

## 2014-07-16 ENCOUNTER — Telehealth: Payer: Self-pay | Admitting: *Deleted

## 2014-07-16 NOTE — Telephone Encounter (Signed)
Brenda [name] missed an appointment for follow up.  Had pelvic infection x2 after RATH. Concerning for TOA.  Per Dr. Maylene Roes- Tamala Julian called patient to find out if she is having any symptoms such as pain, discharge. If she is- she needs to reshedule her appointment.   Called Brenda [name] and left  Message we need to speak with you regarding some information- please call clinic and ask for nurse.

## 2014-07-17 NOTE — Telephone Encounter (Signed)
Called Brenda [name] again and left another message we are calling re: your missed appointment and want to talk with you about how you are doing, please call the office and speak with a nurse

## 2014-07-17 NOTE — Telephone Encounter (Signed)
Brenda [name] called back and I notified her she had missed an appointment . She states she did not know she had an appointment scheduled. I Informed her that her doctor wanted to know if she was doing ok or if she was having pain, discharge or any problems. She states she is doing fine- denies pain ,discharge or any problems. She states she took all her antibiotics. I informed her if she is fine, she does not need to reschedule, but if she begins to have any problems/ pain/ discharge/ etc to call to be seen again. She voiced understanding. I also notified her I would send a note to her doctor.

## 2015-03-26 ENCOUNTER — Telehealth: Payer: Self-pay | Admitting: *Deleted

## 2015-03-26 NOTE — Telephone Encounter (Addendum)
Pt left message stating that she is a pt of Drs Ihor Dow and Constant. She had hysterectomy in September 2015 and is now having pelvic spasms. Please call back. If no answer, please call work phone # [phone]. I returned pt's call @ work # and discussed her concern. She stated that she would like an appointment after January 1 because her insurance will be back in effect at that time. 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 I advised pt that I will have a staff member call her with appt details.  Pt agreed and voiced understanding.

## 2015-04-17 ENCOUNTER — Ambulatory Visit: Payer: No Typology Code available for payment source | Admitting: Obstetrics & Gynecology

## 2015-09-10 IMAGING — CT CT ABD-PELV W/ CM
1 of 3 series · 14 of 32 positions shown, 19 images · IV contrast (OMNIPAQUE)
Comparison: None.

CLINICAL DATA: Pt had abdominal hysterectomy on Thursday December, 2013
without any complications. Pt started yesterday with lower abd pain,
took 1200mg ibuprofen for pain yesterday with minimal relief.
Pressure when she voids, noticed a strong odor with urine and a dark
color. Fever.

EXAM:
CT ABDOMEN AND PELVIS WITH CONTRAST
TECHNIQUE: Multidetector CT imaging of the abdomen and pelvis was performed
using the standard protocol following bolus administration of
intravenous contrast.
CONTRAST:  100mL OMNIPAQUE IOHEXOL 300 MG/ML  SOLN

[Series 2: routine abdomen/pelvis with · axial · 0.84mm/px · z∈[-572,-138]mm · 14 of 97 slices shown, 19 images]
[im 5/97  soft-tissue]
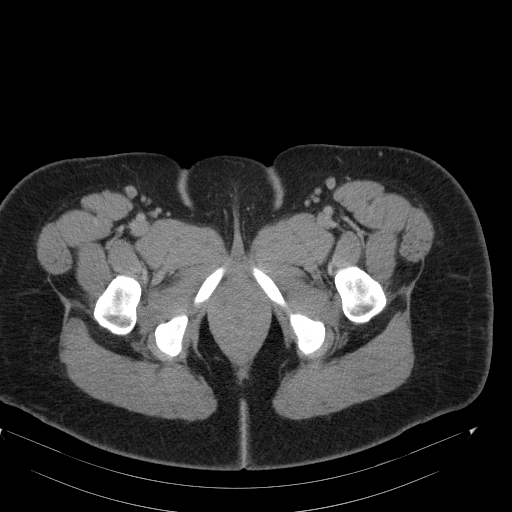
[im 5/97  bone]
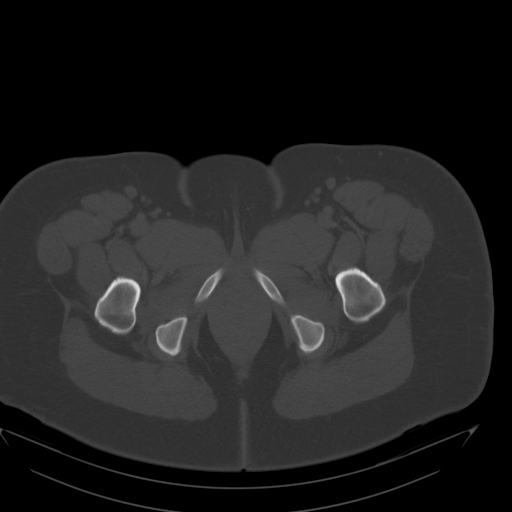
[im 15/97  soft-tissue]
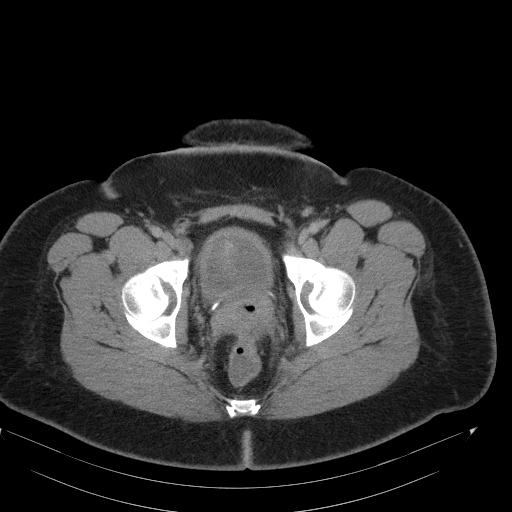
[im 20/97  soft-tissue]
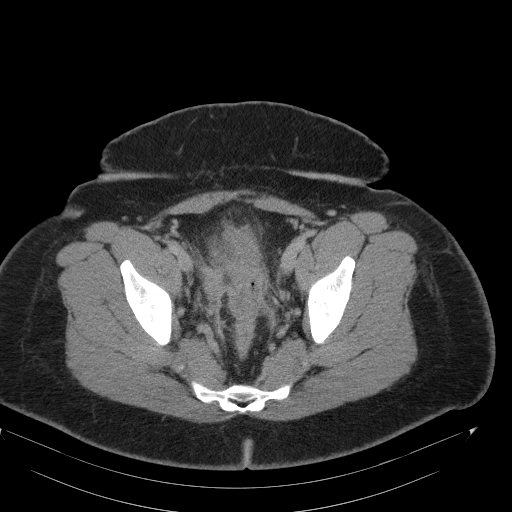
[im 29/97  soft-tissue]
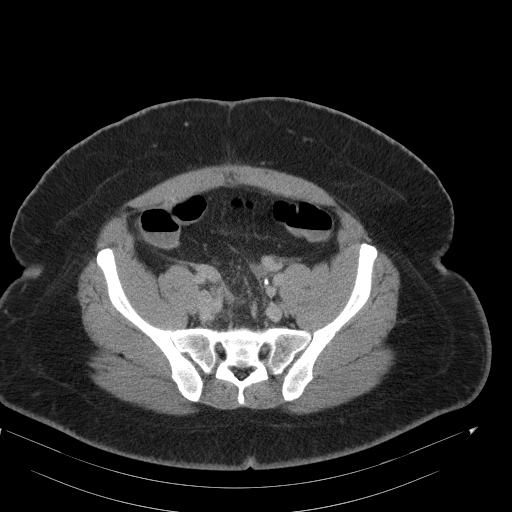
[im 34/97  soft-tissue]
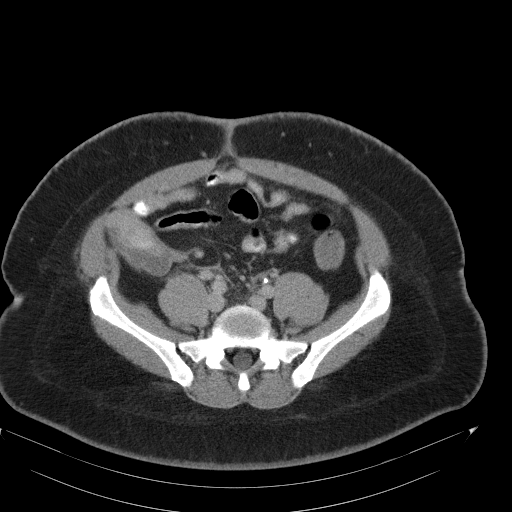
[im 44/97  soft-tissue]
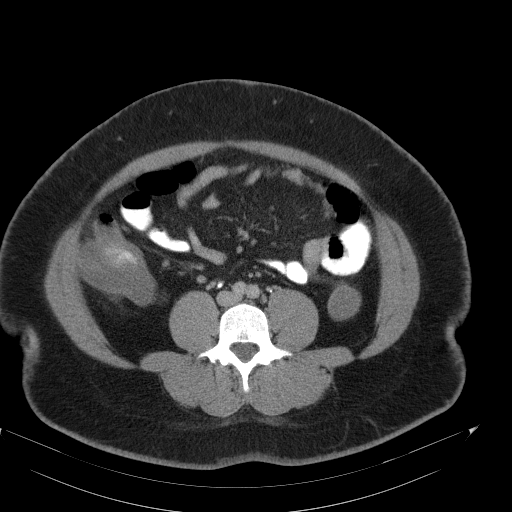
[im 49/97  soft-tissue]
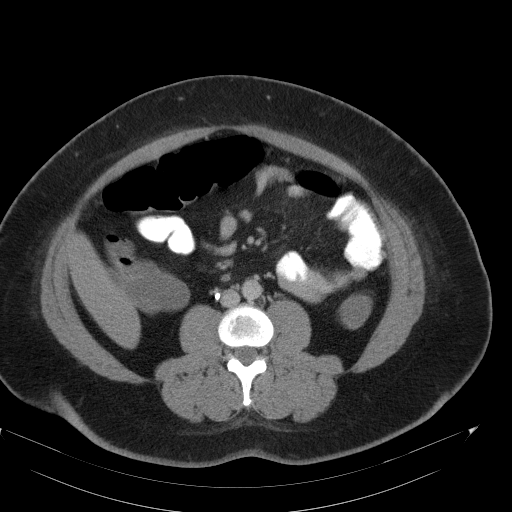
[im 53/97  soft-tissue]
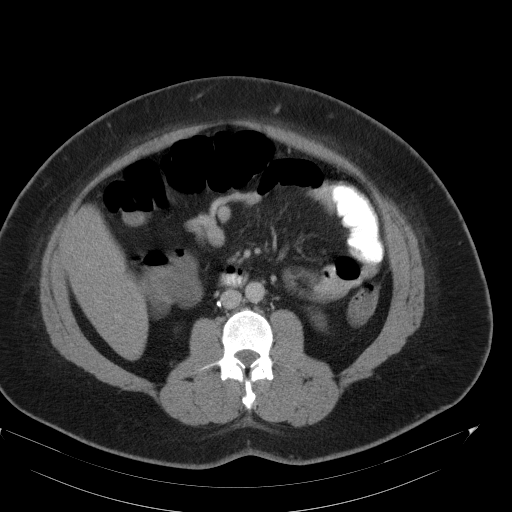
[im 63/97  soft-tissue]
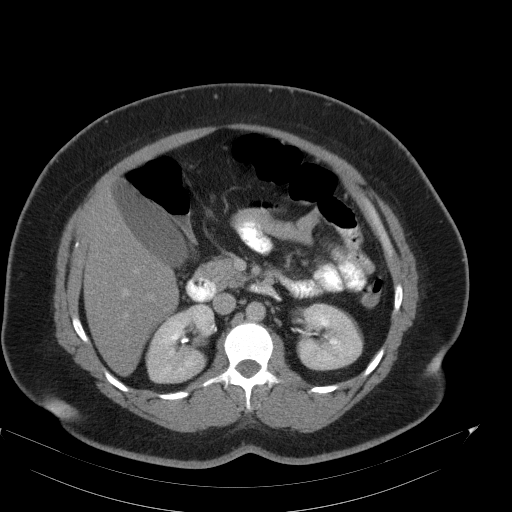
[im 63/97  bone]
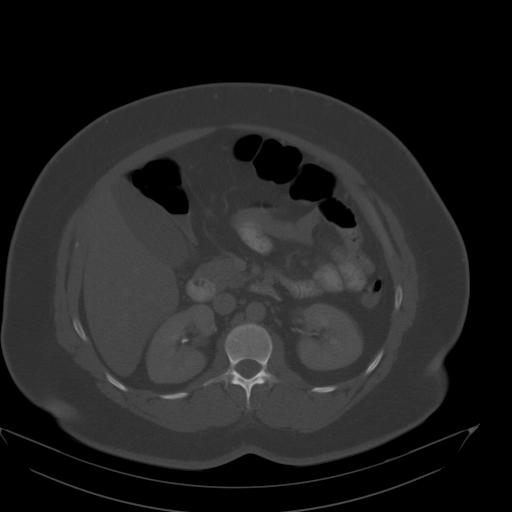
[im 68/97  soft-tissue]
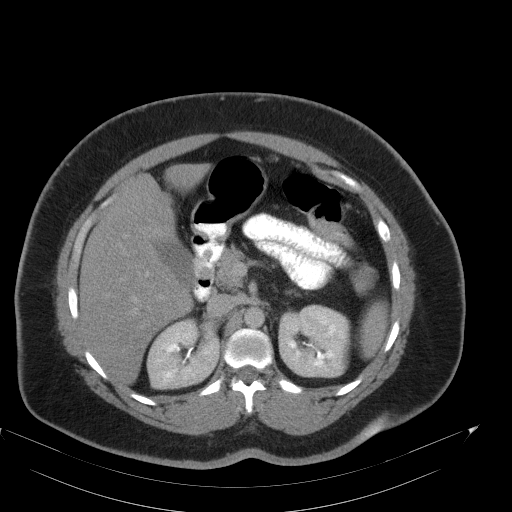
[im 77/97  soft-tissue]
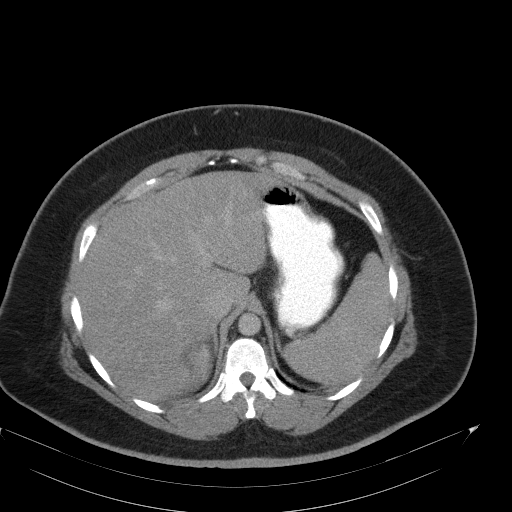
[im 77/97  lung]
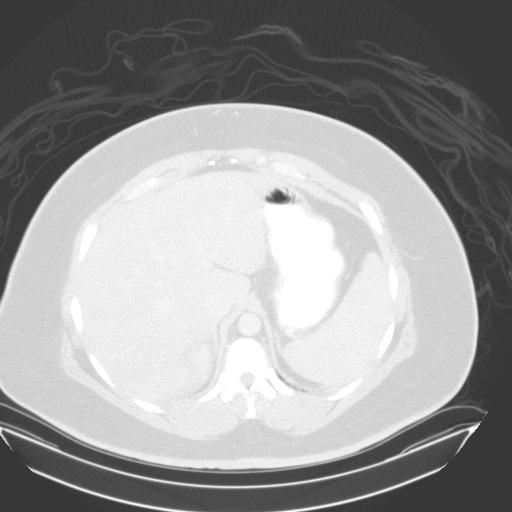
[im 82/97  soft-tissue]
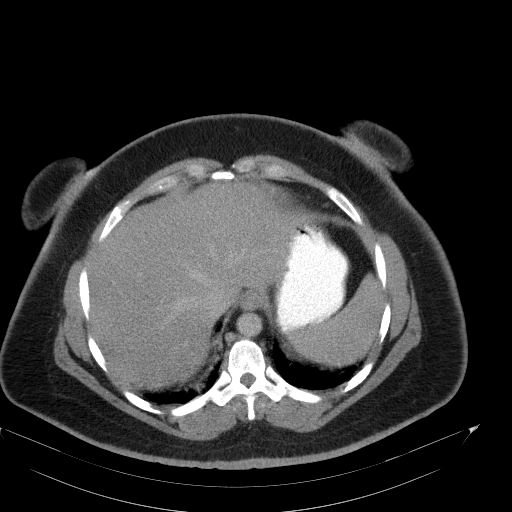
[im 82/97  lung]
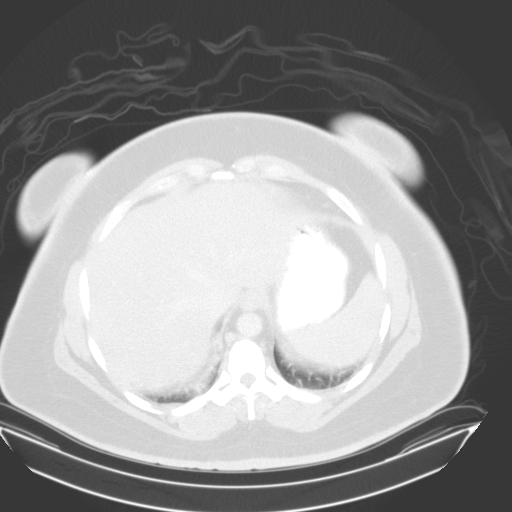
[im 87/97  lung]
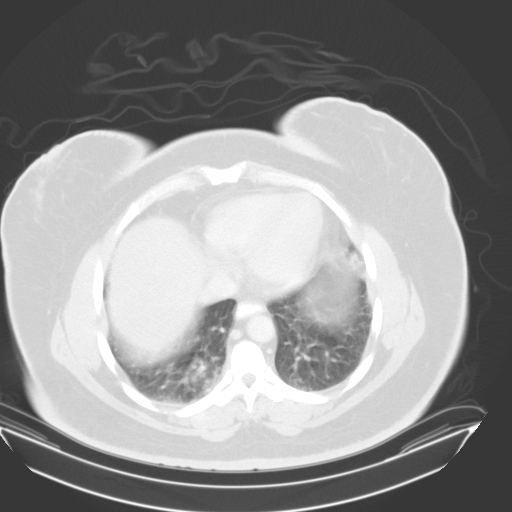
[im 92/97  soft-tissue]
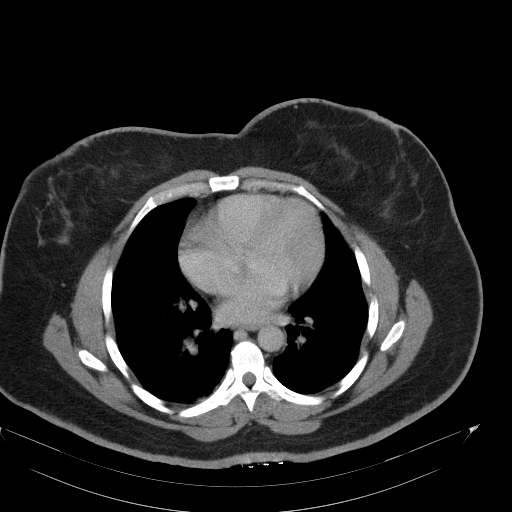
[im 92/97  lung]
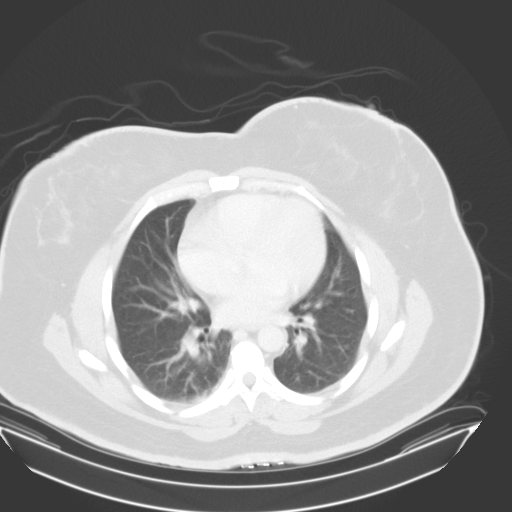

[14 of 32 positions shown; findings below may reference images not displayed]

FINDINGS: Bladder is only mildly distended. The wall appears thickened with
some hazy stranding in the perivesicular fat. Additional fat
stranding is seen adjacent to the ovaries and adnexa. No pelvic
fluid collection is seen to suggest an abscess. Uterus is surgically
absent.

There are air-fluid levels within the small bowel and colon. No
bowel wall thickening. No bowel dilation is seen to suggest
obstruction. This suggests a mild diffuse adynamic ileus.

Lungs show mild basilar atelectasis.  Heart is normal in size.

Fatty infiltration of the liver.  No liver mass or focal lesion.

Spleen, gallbladder, pancreas, adrenal glands: Normal. Normal
kidneys and ureters.

No pathologically enlarged lymph nodes.  No ascites.

Mild loss of disc height at L4-L5. Bony structures otherwise
unremarkable.
IMPRESSION: 1. Bladder wall thickening with adjacent fat inflammation. Findings
are consistent with cystitis.
2. No abnormal fluid collections. No evidence of a postoperative
abscess.
3. Air-fluid levels within the colon and small bowel with no
evidence of bowel wall thickening or inflammation. Findings are
consistent with a mild adynamic ileus.
4. Hepatic steatosis.

## 2015-09-11 IMAGING — CT CT ANGIO CHEST
2 of 9 series · 18 of 46 positions shown · IV contrast (omnipaque)
Comparison: Chest x-ray February 12, 2014

CLINICAL DATA: Shortness of breath. Trouble with heart racing since
February 11, 2014.

EXAM:
CT ANGIOGRAPHY CHEST WITH CONTRAST
TECHNIQUE: Multidetector CT imaging of the chest was performed using the
standard protocol during bolus administration of intravenous
contrast. Multiplanar CT image reconstructions and MIPs were
obtained to evaluate the vascular anatomy.
CONTRAST:  75mL OMNIPAQUE IOHEXOL 350 MG/ML SOLN

[Series 6: thins · axial · 0.79mm/px · z∈[+1059,+1279]mm · 15 of 248 slices shown]
[im 14/248  lung]
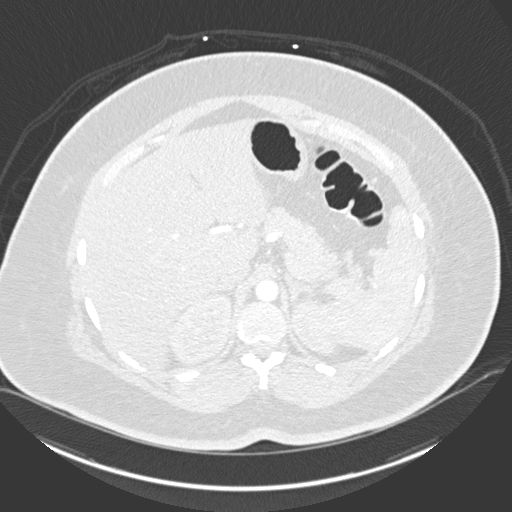
[im 28/248  soft-tissue]
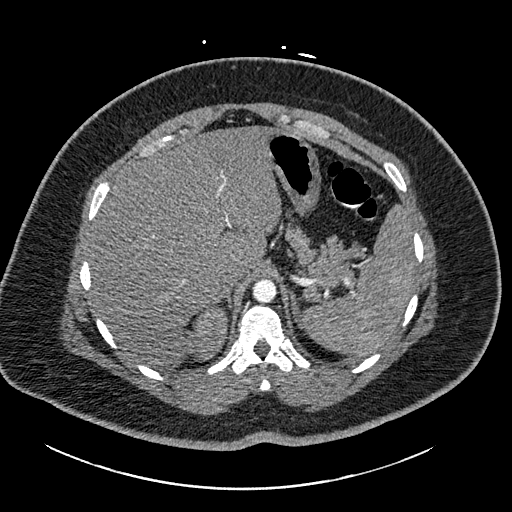
[im 42/248  lung]
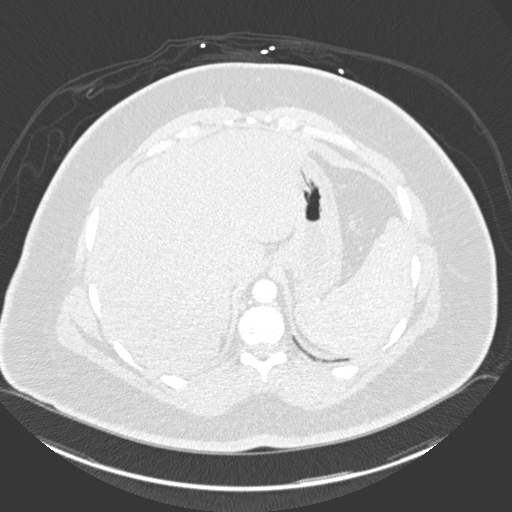
[im 55/248  soft-tissue]
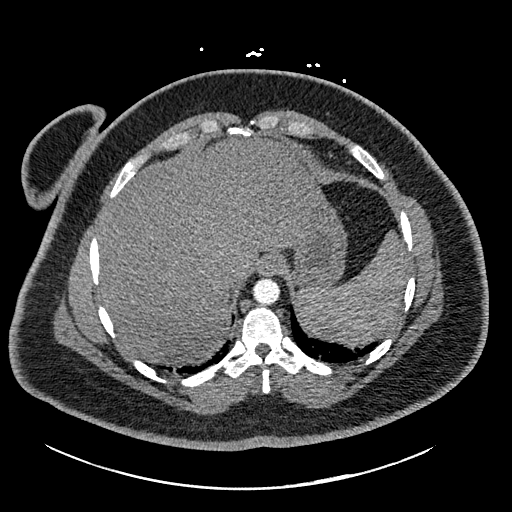
[im 83/248  lung]
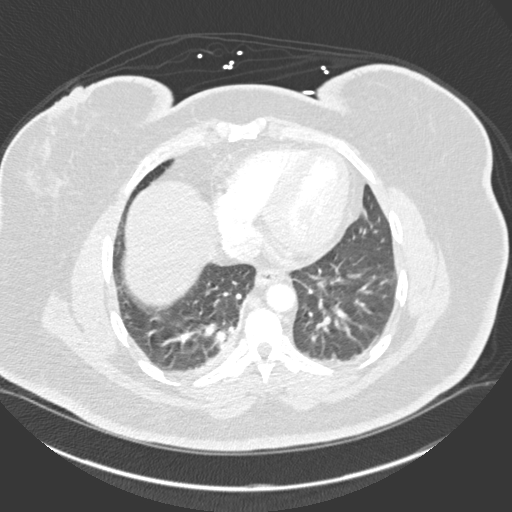
[im 97/248  soft-tissue]
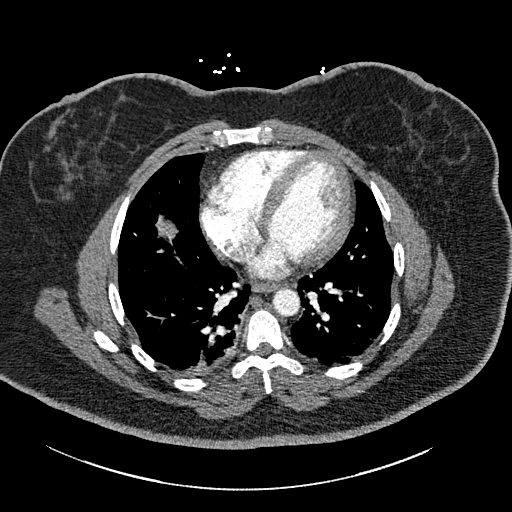
[im 110/248  lung]
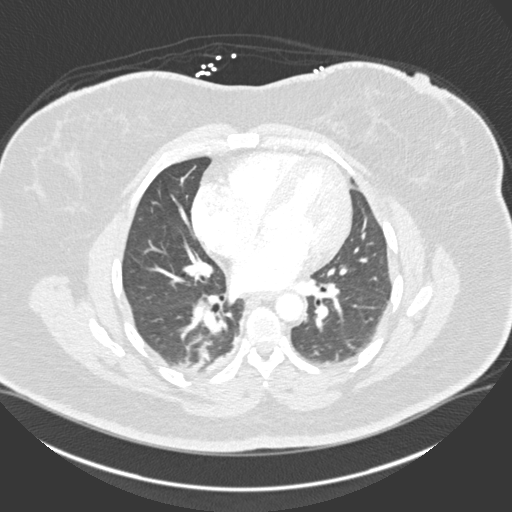
[im 124/248  soft-tissue]
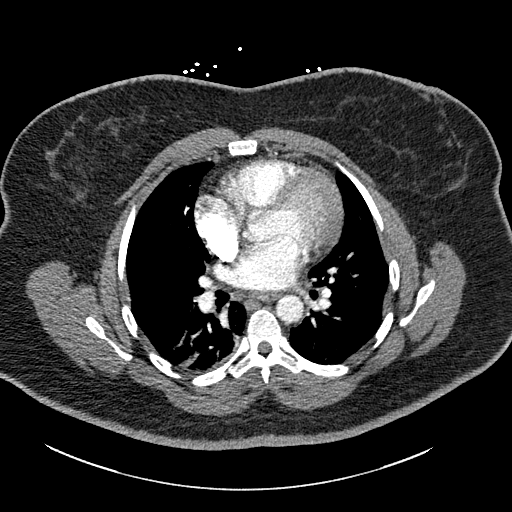
[im 138/248  lung]
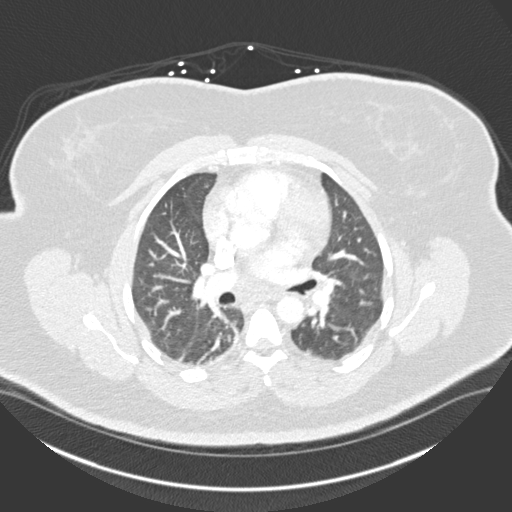
[im 151/248  soft-tissue]
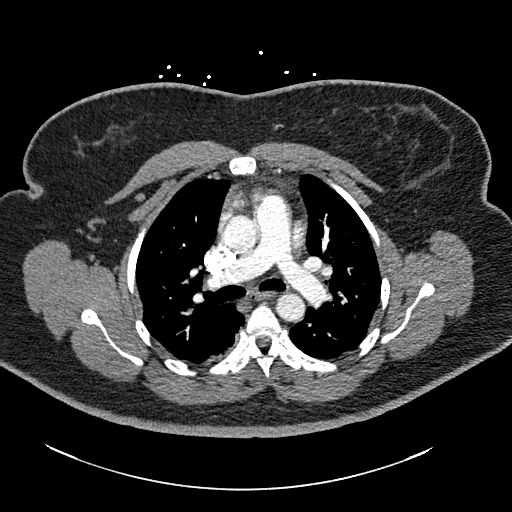
[im 165/248  lung]
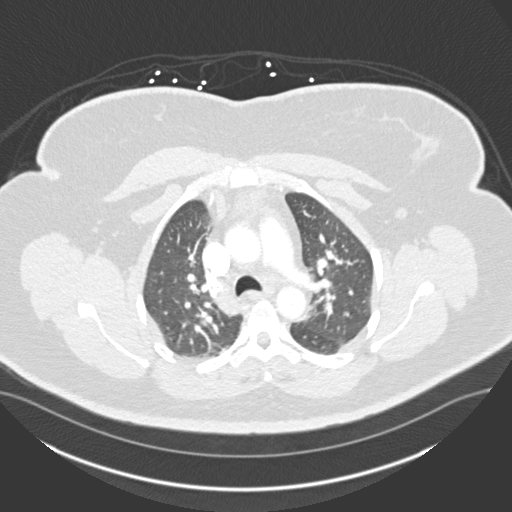
[im 193/248  soft-tissue]
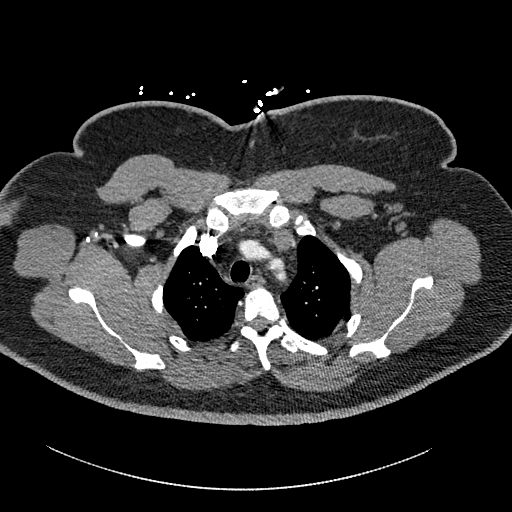
[im 206/248  lung]
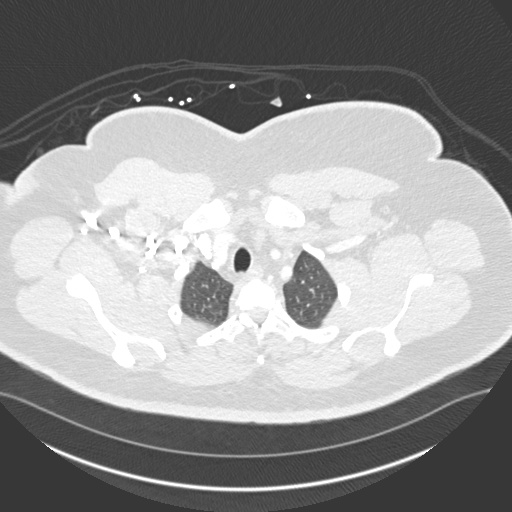
[im 220/248  soft-tissue]
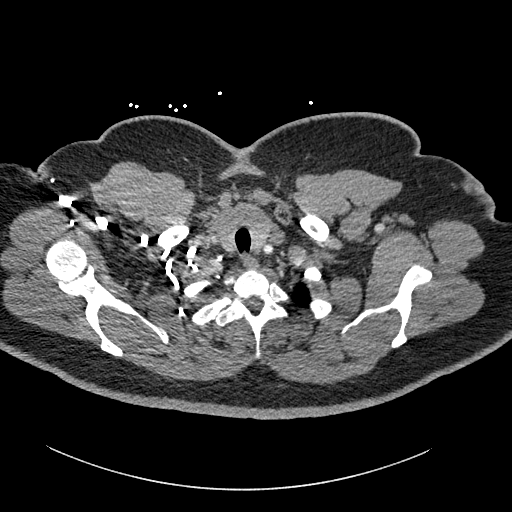
[im 234/248  lung]
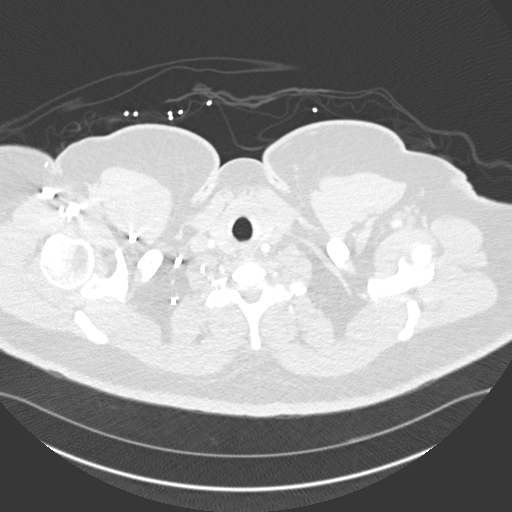

[Series 8: coronal mpr · coronal · 0.51mm/px · 3 of 129 slices shown]
[im 33/129  soft-tissue]
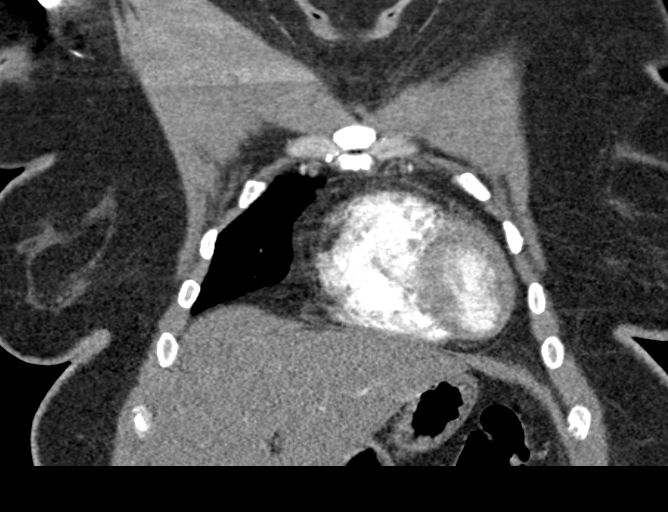
[im 65/129  soft-tissue]
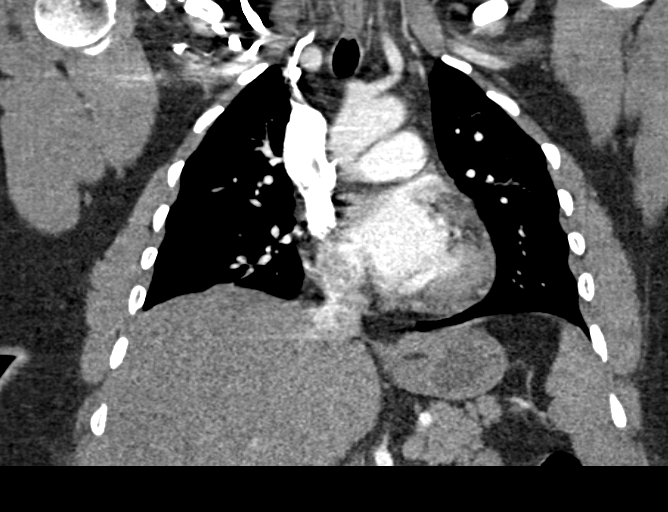
[im 97/129  soft-tissue]
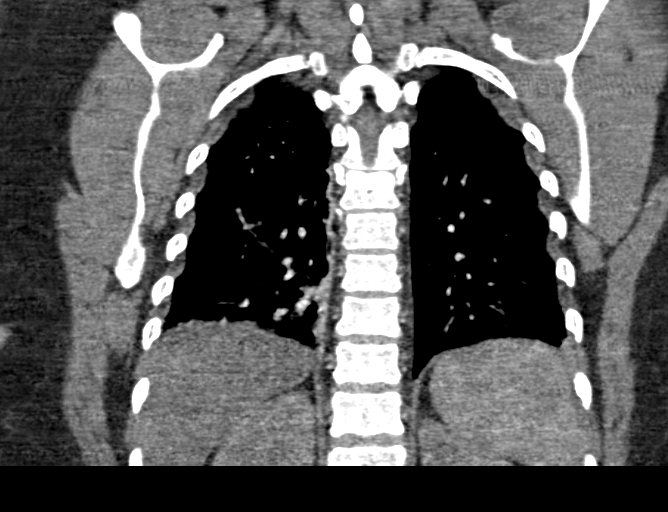

[18 of 46 positions shown; findings below may reference images not displayed]

FINDINGS: There is no pulmonary embolus. The aorta is normal without
aneurysmal dilatation or dissection. There is no mediastinal or
hilar lymphadenopathy. The heart size is upper limits of normal.
There is no pericardial effusion. Images of the lungs demonstrate
dependent atelectasis of the posterior lung bases, right greater
than left. There is no focal pneumonia or pleural effusion. The
visualized upper abdominal structures are unremarkable. There is a
small hiatal hernia.

Review of the MIP images confirms the above findings.
IMPRESSION: No pulmonary embolus. Mild dependent atelectasis of the posterior
lung bases, right greater than left.

## 2015-12-03 ENCOUNTER — Encounter (HOSPITAL_COMMUNITY): Payer: Self-pay | Admitting: *Deleted

## 2015-12-03 ENCOUNTER — Emergency Department (HOSPITAL_COMMUNITY)
Admission: EM | Admit: 2015-12-03 | Discharge: 2015-12-04 | Disposition: A | Discharge status: Home / Self Care | Payer: Self-pay | Attending: Emergency Medicine | Admitting: Emergency Medicine

## 2015-12-03 ENCOUNTER — Emergency Department (HOSPITAL_COMMUNITY): Payer: Self-pay

## 2015-12-03 DIAGNOSIS — I10 Essential (primary) hypertension: Secondary | ICD-10-CM | POA: Insufficient documentation

## 2015-12-03 DIAGNOSIS — L0291 Cutaneous abscess, unspecified: Secondary | ICD-10-CM

## 2015-12-03 DIAGNOSIS — L0211 Cutaneous abscess of neck: Secondary | ICD-10-CM | POA: Insufficient documentation

## 2015-12-03 LAB — CBC WITH DIFFERENTIAL/PLATELET
Basophils Absolute: 0 10*3/uL (ref 0.0–0.1)
Basophils Relative: 0 %
EOS ABS: 0.1 10*3/uL (ref 0.0–0.7)
Eosinophils Relative: 1 %
HEMATOCRIT: 36.7 % (ref 36.0–46.0)
HEMOGLOBIN: 11.6 g/dL — AB (ref 12.0–15.0)
LYMPHS ABS: 2.6 10*3/uL (ref 0.7–4.0)
Lymphocytes Relative: 33 %
MCH: 28.5 pg (ref 26.0–34.0)
MCHC: 31.6 g/dL (ref 30.0–36.0)
MCV: 90.2 fL (ref 78.0–100.0)
MONOS PCT: 6 %
Monocytes Absolute: 0.5 10*3/uL (ref 0.1–1.0)
NEUTROS ABS: 4.8 10*3/uL (ref 1.7–7.7)
NEUTROS PCT: 60 %
Platelets: 211 10*3/uL (ref 150–400)
RBC: 4.07 MIL/uL (ref 3.87–5.11)
RDW: 12.9 % (ref 11.5–15.5)
WBC: 8.1 10*3/uL (ref 4.0–10.5)

## 2015-12-03 LAB — BASIC METABOLIC PANEL
Anion gap: 9 (ref 5–15)
BUN: 9 mg/dL (ref 6–20)
CHLORIDE: 100 mmol/L — AB (ref 101–111)
CO2: 27 mmol/L (ref 22–32)
CREATININE: 0.89 mg/dL (ref 0.44–1.00)
Calcium: 9.2 mg/dL (ref 8.9–10.3)
GFR calc non Af Amer: >60 mL/min (ref >60)
Glucose, Bld: 130 mg/dL — ABNORMAL HIGH (ref 65–99)
POTASSIUM: 3.9 mmol/L (ref 3.5–5.1)
SODIUM: 136 mmol/L (ref 135–145)

## 2015-12-03 LAB — I-STAT BETA HCG BLOOD, ED (MC, WL, AP ONLY)

## 2015-12-03 MED ORDER — IOPAMIDOL (ISOVUE-300) INJECTION 61%
INTRAVENOUS | Status: AC
  Administered 2015-12-03: 75 mL
  Filled 2015-12-03: qty 75

## 2015-12-03 MED ORDER — LIDOCAINE-EPINEPHRINE (PF) 2 %-1:200000 IJ SOLN
10.0000 mL | Freq: Once | INTRAMUSCULAR | Status: AC
  Administered 2015-12-04: 10 mL
  Filled 2015-12-03: qty 20

## 2015-12-03 MED ORDER — SODIUM CHLORIDE 0.9 % IV BOLUS (SEPSIS)
1000.0000 mL | Freq: Once | INTRAVENOUS | Status: AC
  Administered 2015-12-03: 1000 mL via INTRAVENOUS

## 2015-12-03 NOTE — ED Notes (Signed)
PA at bedside.

## 2015-12-03 NOTE — ED Provider Notes (Signed)
[facility] DEPT Provider Note   CSN: [id number] Arrival date & time: 12/03/15  1810     History   Chief Complaint Chief Complaint  Patient presents with  . Abscess    HPI Brenda [name] is a 45 y.o. female.  HPI   Patient has hx of anemia, headache, heart murmur, hypertension, tubo-ovarian abscess, colitis infections,  and blood transfusion presents to the ER with abscess to the left side of her neck. She said it started a few days ago and was originally very small but started to get bigger very fast.  She is now having pain moving her neck and pain to her left eye and ear. She had a low grade temperature and took some Tylenol but she has not been feeling weak or dizzy. She denies headaches or vomiting. Denies IV drug use. She endorses a history of abscesses in various areas on her body.  Past Medical History:  Diagnosis Date  . Abnormal menstrual cycle    "been bleeding since 08/29/2013" (11/01/2013)  . Anemia   . Endometrial polyp 2004   "polyps around the womb"  . [id number])    "weekly" (11/01/2013)  . Heart murmur   . History of blood transfusion 11/01/2013   related to abnormal menstrual cycle  . Hypertension     Patient Active Problem List   Diagnosis Date Noted  . Tubo-ovarian abscess 06/04/2014  . TOA (tubo-ovarian abscess) 06/04/2014  . Colitis, infectious 02/16/2014  . CAP (community acquired pneumonia) 02/16/2014  . Obesity (BMI 30-39.9) 02/14/2014  . Essential hypertension 02/14/2014  . Hepatic steatosis 02/14/2014  . UTI (lower urinary tract infection) 02/12/2014  . Tachycardia 02/12/2014  . Abdominal pain   . Generalized abdominal pain   . Pyrexia   . Hypoxia   . Postoperative state 12/12/2013  . Symptomatic anemia 11/01/2013  . Abnormal uterine bleeding 09/06/2013    Past Surgical History:  Procedure Laterality Date  . CESAREAN SECTION  1994  . CYSTOSCOPY N/A 12/12/2013   Procedure: CYSTOSCOPY;  Surgeon: Lavonia Drafts, MD;   Location: [facility];  Service: Gynecology;  Laterality: N/A;  . DILATION AND CURETTAGE OF UTERUS  2004   for endometrial polyp  . ROBOTIC ASSISTED TOTAL HYSTERECTOMY Bilateral 12/12/2013   Procedure: ROBOTIC ASSISTED TOTAL HYSTERECTOMY WITH BILATERAL SALPINGECTOMY;  Surgeon: Lavonia Drafts, MD;  Location: [facility];  Service: Gynecology;  Laterality: Bilateral;  . TUBAL LIGATION  1994    OB History    Gravida Para Term Preterm AB Living   2 2 2     3    SAB TAB Ectopic Multiple Live Births         1 3       Home Medications    Prior to Admission medications   Medication Sig Start Date End Date Taking? Authorizing Provider  docusate sodium (COLACE) 100 MG capsule Take 1 capsule (100 mg total) by mouth 2 (two) times daily as needed for mild constipation. Patient not taking: Reported on 06/21/2014 06/06/14   Osborne Oman, MD  doxycycline (VIBRA-TABS) 100 MG tablet Take 1 tablet (100 mg total) by mouth every 12 (twelve) hours. 06/06/14   Osborne Oman, MD  doxycycline (VIBRAMYCIN) 100 MG capsule Take 1 capsule (100 mg total) by mouth 2 (two) times daily. 12/04/15   Delos Haring, PA-C  ferrous fumarate (HEMOCYTE - 106 MG FE) 325 (106 FE) MG TABS tablet Take 1 tablet by mouth 3 (three) times daily.    Historical Provider, MD  HYDROcodone-acetaminophen (NORCO/VICODIN) 5-325  MG tablet Take 1-2 tablets by mouth every 4 (four) hours as needed. 12/04/15   Delos Haring, PA-C  losartan-hydrochlorothiazide (HYZAAR) 50-12.5 MG per tablet Take 1 tablet by mouth daily.    Historical Provider, MD  Multiple Vitamin (MULTIVITAMIN WITH MINERALS) TABS tablet Take 1 tablet by mouth daily.    Historical Provider, MD    Family History Family History  Problem Relation Age of Onset  . Hypertension Maternal Grandmother     Social History Social History  Substance Use Topics  . Smoking status: Never Smoker  . Smokeless tobacco: Never Used  . Alcohol use No     Allergies   Review of patient's  allergies indicates no known allergies.   Review of Systems Review of Systems  Review of Systems All other systems negative except as documented in the HPI. All pertinent positives and negatives as reviewed in the HPI.  Physical Exam Updated Vital Signs BP 146/100   Pulse 95   Temp 98.6 F (37 C) (Oral)   Resp 18   LMP 12/01/2013 (Approximate)   SpO2 98%   Physical Exam  Constitutional: She is oriented to person, place, and time. She appears well-developed and well-nourished. No distress.  HENT:  Head: Normocephalic and atraumatic.  Right Ear: Tympanic membrane and ear canal normal.  Left Ear: Tympanic membrane and ear canal normal.  Nose: Nose normal.  Mouth/Throat: Uvula is midline, oropharynx is clear and moist and mucous membranes are normal.  Eyes: Pupils are equal, round, and reactive to light.  Neck: Normal range of motion. Neck supple.    Cardiovascular: Normal rate and regular rhythm.   Pulmonary/Chest: Effort normal.  Abdominal: Soft.  No signs of abdominal distention  Musculoskeletal:  No LE swelling  Neurological: She is alert and oriented to person, place, and time.  Acting at baseline  Skin: Skin is warm and dry. No rash noted.  Nursing note and vitals reviewed.    ED Treatments / Results  Labs (all labs ordered are listed, but only abnormal results are displayed) Labs Reviewed  CBC WITH DIFFERENTIAL/PLATELET - Abnormal; Notable for the following:       Result Value   Hemoglobin 11.6 (*)    All other components within normal limits  BASIC METABOLIC PANEL - Abnormal; Notable for the following:    Chloride 100 (*)    Glucose, Bld 130 (*)    All other components within normal limits  I-STAT BETA HCG BLOOD, ED ([facility], [facility], [facility] ONLY)    EKG  EKG Interpretation None       Radiology Ct Soft Tissue Neck W Contrast  Result Date: 12/04/2015 CLINICAL DATA:  Neck abscess, left posterior neck. Pain radiating to left ear and left eye EXAM: CT NECK WITH  CONTRAST TECHNIQUE: Multidetector CT imaging of the neck was performed using the standard protocol following the bolus administration of intravenous contrast. CONTRAST:  75 mL ISOVUE-300 IOPAMIDOL (ISOVUE-300) INJECTION 61% COMPARISON:  None. FINDINGS: Marked area: The site of the palpable abnormality at the left posterior neck was marked by the technologist prior to the scan using a radiopaque marker. There is skin thickening with a associated fluid collection measuring 3.1 x 1.7 cm directly underlying the marker. There is inflammatory infiltration of the adjacent fat. No extension into the nearby neck musculature. Pharynx and larynx: The nasopharynx is clear. The oropharynx and hypopharynx are normal. The epiglottis is normal. The supraglottic larynx, glottis and subglottic larynx are normal. Salivary glands: The parotid and submandibular glands are normal.  No sialolithiasis or salivary ductal dilatation. Thyroid: Normal Lymph nodes: Left level 5A lymph node measures 1 cm in short axis. There are other subcentimeter left level 2 lymph nodes noted. Vascular: The major cervical vessels are normal. Limited intracranial: Unremarkable Visualized orbits: Normal Mastoids and visualized paranasal sinuses: Clear Skeleton: Unremarkable Upper chest: No pulmonary nodule or pleural effusion. IMPRESSION: 1. Fluid collection within the skin of the posterior left neck with surrounding inflammatory stranding, likely indicating abscess. 2. Reactive left cervical lymph nodes. Electronically Signed   By: Ulyses Jarred M.D.   On: 12/04/2015 00:30    Procedures Procedures (including critical care time)  Medications Ordered in ED Medications  lidocaine-EPINEPHrine (XYLOCAINE W/EPI) 2 %-1:200000 (PF) injection 10 mL (not administered)  doxycycline (VIBRA-TABS) tablet 100 mg (not administered)  sodium chloride 0.9 % bolus 1,000 mL (1,000 mLs Intravenous New Bag/Given 12/03/15 2242)  iopamidol (ISOVUE-300) 61 % injection (75 mLs   Contrast Given 12/03/15 2329)     Initial Impression / Assessment and Plan / ED Course  I have reviewed the triage vital signs and the nursing notes.  Pertinent labs & imaging results that were available during my care of the patient were reviewed by me and considered in my medical decision making (see chart for details).  Clinical Course    INCISION AND DRAINAGE Performed by: Linus Mako Consent: Verbal consent obtained. Risks and benefits: risks, benefits and alternatives were discussed Type: abscess  Body area: left neck  Anesthesia: local infiltration  Incision was made with a scalpel.  Local anesthetic: lidocaine w % with epinephrine  Anesthetic total: 2 ml  Complexity: complex - a very superficial laceration was made and puss was expressed. Blunt dissection to break up loculations  Drainage: purulent  Drainage amount: moderate  Packing material: 1/4 ' small amount of packing placed to keep incision open  Patient tolerance: Patient tolerated the procedure well with no immediate complications.    Final Clinical Impressions(s) / ED Diagnoses   Final diagnoses:  Abscess   Patient is well-appearing. She does not have a leukocytosis or fever in the ED, the CT scan shows that the abscess is superficial and is not infiltrating the fat or deeper structures of the neck. Very superficial decision was made and pus was expressed. Patient will be started on doxycycline and I've asked that she return to either her primary care doctor, in urgent care the emergency department in 2 days for wound recheck, to have packing removed and make sure she is improving.   She tolerated the procedure well.  The patient appears reasonably screened and/or stabilized for discharge and I doubt any other emergent medical condition requiring further screening, evaluation, or treatment in the ED prior to discharge.   New Prescriptions New Prescriptions   DOXYCYCLINE (VIBRAMYCIN) 100 MG  CAPSULE    Take 1 capsule (100 mg total) by mouth 2 (two) times daily.   HYDROCODONE-ACETAMINOPHEN (NORCO/VICODIN) 5-325 MG TABLET    Take 1-2 tablets by mouth every 4 (four) hours as needed.         Delos Haring, PA-C 12/04/15 [id number]    Malvin Johns, MD 12/06/15 2255

## 2015-12-03 NOTE — ED Triage Notes (Signed)
Pt here with neck abscess since about Thursday and now with pain to left ear and left eye

## 2015-12-04 MED ORDER — HYDROCODONE-ACETAMINOPHEN 5-325 MG PO TABS: 1.0000 | ORAL_TABLET | ORAL | 0 refills | Status: DC | PRN

## 2015-12-04 MED ORDER — ACETAMINOPHEN 325 MG PO TABS
650.0000 mg | ORAL_TABLET | Freq: Once | ORAL | Status: AC
  Administered 2015-12-04: 650 mg via ORAL
  Filled 2015-12-04: qty 2

## 2015-12-04 MED ORDER — DOXYCYCLINE HYCLATE 100 MG PO CAPS: 100.0000 mg | ORAL_CAPSULE | Freq: Two times a day (BID) | ORAL | 0 refills | Status: DC

## 2015-12-04 MED ORDER — DOXYCYCLINE HYCLATE 100 MG PO TABS
100.0000 mg | ORAL_TABLET | Freq: Once | ORAL | Status: AC
  Administered 2015-12-04: 100 mg via ORAL
  Filled 2015-12-04: qty 1

## 2015-12-04 NOTE — ED Notes (Signed)
Patient Alert and oriented X4. Stable and ambulatory. Patient verbalized understanding of the discharge instructions.  Patient belongings were taken by the patient.  

## 2016-11-09 ENCOUNTER — Ambulatory Visit (HOSPITAL_COMMUNITY)
Admission: EM | Admit: 2016-11-09 | Discharge: 2016-11-09 | Disposition: A | Discharge status: Home / Self Care | Payer: Self-pay | Attending: Internal Medicine | Admitting: Internal Medicine

## 2016-11-09 ENCOUNTER — Encounter (HOSPITAL_COMMUNITY): Payer: Self-pay | Admitting: Emergency Medicine

## 2016-11-09 DIAGNOSIS — L0211 Cutaneous abscess of neck: Secondary | ICD-10-CM | POA: Insufficient documentation

## 2016-11-09 DIAGNOSIS — L0291 Cutaneous abscess, unspecified: Secondary | ICD-10-CM

## 2016-11-09 MED ORDER — LIDOCAINE HCL (PF) 1 % IJ SOLN
INTRAMUSCULAR | Status: AC
  Filled 2016-11-09: qty 2

## 2016-11-09 MED ORDER — CEFTRIAXONE SODIUM 1 G IJ SOLR
1.0000 g | Freq: Once | INTRAMUSCULAR | Status: AC
  Administered 2016-11-09: 1 g via INTRAMUSCULAR

## 2016-11-09 MED ORDER — DOXYCYCLINE HYCLATE 100 MG PO CAPS: 100.0000 mg | ORAL_CAPSULE | Freq: Two times a day (BID) | ORAL | 0 refills | Status: DC

## 2016-11-09 MED ORDER — CEFTRIAXONE SODIUM 1 G IJ SOLR
INTRAMUSCULAR | Status: AC
  Filled 2016-11-09: qty 10

## 2016-11-09 MED ORDER — LIDOCAINE-EPINEPHRINE (PF) 2 %-1:200000 IJ SOLN
INTRAMUSCULAR | Status: AC
  Filled 2016-11-09: qty 20

## 2016-11-09 NOTE — Discharge Instructions (Signed)
Your abscess has been opened and drained, you've been given injection of Rocephin here in clinic, started on doxycycline to take at home. Keep the area clean and dry, monitor for signs and symptoms of infection. If you have any worsening of symptoms go to the ER. Cultures were taken and will be sent for sensitivity. If we need to change your antibiotics we will notify.

## 2016-11-09 NOTE — ED Triage Notes (Signed)
The patient presented to the [facility] with a complaint of a neck abscess x 1 month.

## 2016-11-09 NOTE — ED Provider Notes (Signed)
  [facility]   [id number] 11/09/16 Arrival Time: 5697  ASSESSMENT & PLAN:  1. Abscess     Meds ordered this encounter  Medications  . cefTRIAXone (ROCEPHIN) injection 1 g  . doxycycline (VIBRAMYCIN) 100 MG capsule    Sig: Take 1 capsule (100 mg total) by mouth 2 (two) times daily.    Dispense:  20 capsule    Refill:  0    Order Specific Question:   Supervising Provider    Answer:   Sherlene Shams [[id number]]    Reviewed expectations re: course of current medical issues. Questions answered. Outlined signs and symptoms indicating need for more acute intervention. Patient verbalized understanding. After Visit Summary given.   SUBJECTIVE:  Brenda [name] is a 46 y.o. female who presents with complaint of An abscess to the left side of her neck. She has had a similar abscess in similar location in August of last year that was drained. This abscess is been present for 3 days, and has been getting worse. She denies any fever or chills, malaise pain with movement of the neck. No nausea, vomiting, diarrhea, or any systemic symptoms. She denies a history of diabetes, or any type of immunosuppression. She does not smoke, drink, or use recreational drugs, and currently works as a Social worker.  ROS: As per HPI, remainder of ROS negative.   OBJECTIVE:  Vitals:   11/09/16 1142  BP: (!) 201/114  Pulse: (!) 112  Resp: 20  Temp: 98.4 F (36.9 C)  TempSrc: Oral  SpO2: 95%     General appearance: alert; no distress HEENT: normocephalic; atraumatic; conjunctivae normal; No lymphadenopathy at the rectum or postauricular tonsillar submental or submandibular Neck: Trachea midline no JVD noted, and is a approximately 2 cm x 3 cm abscess on the left lateral side of her neck Lungs: clear to auscultation bilaterally Heart: regular rate and rhythm Abdomen: soft, non-tender; bowel sounds normal; no masses or organomegaly; no guarding or rebound tenderness Back: no CVA  tenderness Extremities: no cyanosis or edema; symmetrical with no gross deformities Skin: warm and dry Neurologic: Grossly normal Psychological:  alert and cooperative; normal mood and affect  Procedures:    Verbal consent were obtained. Area over induration cleaned with betadine. Lidocaine 2% with epinephrine used to obtain local anesthesia. The most fluctuant portion of the abscess was incised with a #11 blade scalpel. Abscess cavity explored and evacuated, all loculations were broken up with a curved hemostat as best as possible given patient discomfort. Cavity was not packed with packing material. The wound was dressed with a clean gauze dressing. Minimal bleeding. No complications. Wound cultures obtained  Wound care instructions discussed and given in written format. Return as needed for wound check   Labs Reviewed  AEROBIC/ANAEROBIC CULTURE (SURGICAL/DEEP WOUND)    No results found.  No Known Allergies  PMHx, SurgHx, SocialHx, Medications, and Allergies were reviewed in the Visit Navigator and updated as appropriate.       Barnet Glasgow, NP 11/09/16 1239

## 2016-11-16 LAB — AEROBIC/ANAEROBIC CULTURE W GRAM STAIN (SURGICAL/DEEP WOUND)

## 2016-11-16 LAB — AEROBIC/ANAEROBIC CULTURE (SURGICAL/DEEP WOUND)

## 2017-04-09 ENCOUNTER — Emergency Department (HOSPITAL_COMMUNITY): Payer: Self-pay

## 2017-04-09 ENCOUNTER — Encounter (HOSPITAL_COMMUNITY): Payer: Self-pay | Admitting: Emergency Medicine

## 2017-04-09 ENCOUNTER — Emergency Department (HOSPITAL_COMMUNITY)
Admission: EM | Admit: 2017-04-09 | Discharge: 2017-04-09 | Disposition: A | Discharge status: Home / Self Care | Payer: Self-pay | Attending: Emergency Medicine | Admitting: Emergency Medicine

## 2017-04-09 DIAGNOSIS — I10 Essential (primary) hypertension: Secondary | ICD-10-CM | POA: Insufficient documentation

## 2017-04-09 DIAGNOSIS — Z79899 Other long term (current) drug therapy: Secondary | ICD-10-CM | POA: Insufficient documentation

## 2017-04-09 DIAGNOSIS — J209 Acute bronchitis, unspecified: Secondary | ICD-10-CM | POA: Insufficient documentation

## 2017-04-09 DIAGNOSIS — J069 Acute upper respiratory infection, unspecified: Secondary | ICD-10-CM | POA: Insufficient documentation

## 2017-04-09 DIAGNOSIS — B9789 Other viral agents as the cause of diseases classified elsewhere: Secondary | ICD-10-CM | POA: Insufficient documentation

## 2017-04-09 MED ORDER — BENZONATATE 100 MG PO CAPS
200.0000 mg | ORAL_CAPSULE | Freq: Once | ORAL | Status: AC
  Administered 2017-04-09: 200 mg via ORAL
  Filled 2017-04-09: qty 2

## 2017-04-09 MED ORDER — IPRATROPIUM-ALBUTEROL 0.5-2.5 (3) MG/3ML IN SOLN
3.0000 mL | Freq: Once | RESPIRATORY_TRACT | Status: AC
  Administered 2017-04-09: 3 mL via RESPIRATORY_TRACT
  Filled 2017-04-09: qty 3

## 2017-04-09 MED ORDER — ALBUTEROL SULFATE (2.5 MG/3ML) 0.083% IN NEBU
5.0000 mg | INHALATION_SOLUTION | Freq: Once | RESPIRATORY_TRACT | Status: AC
  Administered 2017-04-09: 5 mg via RESPIRATORY_TRACT

## 2017-04-09 MED ORDER — AEROCHAMBER PLUS FLO-VU MEDIUM MISC
1.0000 | Freq: Once | Status: AC
  Administered 2017-04-09: 1
  Filled 2017-04-09 (×2): qty 1

## 2017-04-09 MED ORDER — PREDNISONE 20 MG PO TABS
60.0000 mg | ORAL_TABLET | Freq: Once | ORAL | Status: AC
  Administered 2017-04-09: 60 mg via ORAL
  Filled 2017-04-09: qty 3

## 2017-04-09 MED ORDER — ACETAMINOPHEN 325 MG PO TABS
650.0000 mg | ORAL_TABLET | Freq: Once | ORAL | Status: AC
  Administered 2017-04-09: 650 mg via ORAL
  Filled 2017-04-09: qty 2

## 2017-04-09 MED ORDER — BENZONATATE 100 MG PO CAPS: 100.0000 mg | ORAL_CAPSULE | Freq: Three times a day (TID) | ORAL | 0 refills | Status: AC

## 2017-04-09 MED ORDER — ALBUTEROL SULFATE HFA 108 (90 BASE) MCG/ACT IN AERS
2.0000 | INHALATION_SPRAY | Freq: Once | RESPIRATORY_TRACT | Status: AC
Start: 2017-04-09 — End: 2017-04-09
  Administered 2017-04-09: 2 via RESPIRATORY_TRACT
  Filled 2017-04-09: qty 6.7

## 2017-04-09 MED ORDER — PREDNISONE 10 MG (21) PO TBPK: ORAL_TABLET | Freq: Every day | ORAL | 0 refills | Status: AC

## 2017-04-09 NOTE — Discharge Instructions (Signed)
Use inhaler, 2 puffs every 4 hours.  Take prednisone as prescribed until all gone.  Tessalon for cough.  You can also take over-the-counter Robitussin.  Tylenol for any pain and fever.  Follow-up with family doctor as needed.  Return if worsening symptoms.

## 2017-04-09 NOTE — ED Notes (Signed)
Bed: BL10 Expected date: 04/09/17 Expected time:  Means of arrival:  Comments:

## 2017-04-09 NOTE — ED Provider Notes (Signed)
[facility] DEPT Provider Note   CSN: [id number] Arrival date & time: 04/09/17  4696     History   Chief Complaint Chief Complaint  Patient presents with  . Cough  . sinus congestion  . flu like symptoms    HPI Brenda [name] is a 47 y.o. female.  HPI Brenda [name] is a 47 y.o. female presents to emergency department complaining of flulike symptoms.  Patient states that she started coughing, nasal congestion, sore throat, onset yesterday.  She states she had some cough that has been lingering since 2 months ago, but symptoms became worse last night.  She denies taking any medications prior to coming in.  She states she feels short of breath and wheezing.  She does have history of bronchitis, however denies history of asthma or COPD.  She has had to use inhalers in the but does not have 101 at this time.  She does not smoke.  She denies any known fever.  She admits to chills and body aches.  She denies any chest pain, no abdominal pain, no nausea or vomiting she has no other complaints at this time.  Past Medical History:  Diagnosis Date  . Abnormal menstrual cycle    "been bleeding since 08/29/2013" (11/01/2013)  . Anemia   . Endometrial polyp 2004   "polyps around the womb"  . [id number](244.0)    "weekly" (11/01/2013)  . Heart murmur   . History of blood transfusion 11/01/2013   related to abnormal menstrual cycle  . Hypertension     Patient Active Problem List   Diagnosis Date Noted  . Tubo-ovarian abscess 06/04/2014  . TOA (tubo-ovarian abscess) 06/04/2014  . Colitis, infectious 02/16/2014  . CAP (community acquired pneumonia) 02/16/2014  . Obesity (BMI 30-39.9) 02/14/2014  . Essential hypertension 02/14/2014  . Hepatic steatosis 02/14/2014  . UTI (lower urinary tract infection) 02/12/2014  . Tachycardia 02/12/2014  . Abdominal pain   . Generalized abdominal pain   . Pyrexia   . Hypoxia   . Postoperative state 12/12/2013  .  Symptomatic anemia 11/01/2013  . Abnormal uterine bleeding 09/06/2013    Past Surgical History:  Procedure Laterality Date  . CESAREAN SECTION  1994  . CYSTOSCOPY N/A 12/12/2013   Procedure: CYSTOSCOPY;  Surgeon: Lavonia Drafts, MD;  Location: [facility];  Service: Gynecology;  Laterality: N/A;  . DILATION AND CURETTAGE OF UTERUS  2004   for endometrial polyp  . ROBOTIC ASSISTED TOTAL HYSTERECTOMY Bilateral 12/12/2013   Procedure: ROBOTIC ASSISTED TOTAL HYSTERECTOMY WITH BILATERAL SALPINGECTOMY;  Surgeon: Lavonia Drafts, MD;  Location: [facility];  Service: Gynecology;  Laterality: Bilateral;  . TUBAL LIGATION  1994    OB History    Gravida Para Term Preterm AB Living   2 2 2     3    SAB TAB Ectopic Multiple Live Births         1 3       Home Medications    Prior to Admission medications   Medication Sig Start Date End Date Taking? Authorizing Provider  doxycycline (VIBRAMYCIN) 100 MG capsule Take 1 capsule (100 mg total) by mouth 2 (two) times daily. 11/09/16   Barnet Glasgow, NP    Family History Family History  Problem Relation Age of Onset  . Hypertension Maternal Grandmother     Social History Social History   Tobacco Use  . Smoking status: Never Smoker  . Smokeless tobacco: Never Used  Substance Use Topics  . Alcohol use: No  .  Drug use: No     Allergies   Patient has no known allergies.   Review of Systems Review of Systems  Constitutional: Positive for chills. Negative for fever.  HENT: Positive for congestion and sore throat.   Respiratory: Positive for cough, chest tightness and shortness of breath.   Cardiovascular: Negative for chest pain, palpitations and leg swelling.  Gastrointestinal: Negative for abdominal pain, diarrhea, nausea and vomiting.  Genitourinary: Negative for dysuria, flank pain, pelvic pain, vaginal bleeding, vaginal discharge and vaginal pain.  Musculoskeletal: Positive for myalgias. Negative for neck pain and neck  stiffness.  Skin: Negative for rash.  Neurological: Positive for headaches. Negative for dizziness and weakness.  All other systems reviewed and are negative.    Physical Exam Updated Vital Signs BP (!) 156/102   Pulse (!) 115   Temp 98.9 F (37.2 C) (Oral)   Resp 20   LMP 12/01/2013 (Approximate)   SpO2 94%   Physical Exam  Constitutional: She is oriented to person, place, and time. She appears well-developed and well-nourished. No distress.  HENT:  Head: Normocephalic.  Right Ear: External ear normal.  Left Ear: External ear normal.  Mouth/Throat: Oropharynx is clear and moist.  Eyes: Conjunctivae are normal.  Neck: Neck supple.  Cardiovascular: Normal rate, regular rhythm and normal heart sounds.  Pulmonary/Chest: Effort normal. No respiratory distress. She has wheezes. She has no rales.  Wheezing and rhonchi throughout.  Decreased air movement bilaterally.  Patient is coughing.  Abdominal: Soft. Bowel sounds are normal. She exhibits no distension. There is no tenderness. There is no rebound.  Musculoskeletal: She exhibits no edema.  Neurological: She is alert and oriented to person, place, and time.  Skin: Skin is warm and dry.  Psychiatric: She has a normal mood and affect. Her behavior is normal.  Nursing note and vitals reviewed.    ED Treatments / Results  Labs (all labs ordered are listed, but only abnormal results are displayed) Labs Reviewed - No data to display  EKG  EKG Interpretation None       Radiology Dg Chest 2 View  Result Date: 04/09/2017 CLINICAL DATA:  Cough, shortness of Breath EXAM: CHEST  2 VIEW COMPARISON:  02/12/2014 FINDINGS: Heart and mediastinal contours are within normal limits. No focal opacities or effusions. No acute bony abnormality. IMPRESSION: No active cardiopulmonary disease. Electronically Signed   By: Rolm Baptise M.D.   On: 04/09/2017 09:36    Procedures Procedures (including critical care time)  Medications Ordered  in ED Medications  ipratropium-albuterol (DUONEB) 0.5-2.5 (3) MG/3ML nebulizer solution 3 mL (3 mLs Nebulization Given 04/09/17 1013)  predniSONE (DELTASONE) tablet 60 mg (60 mg Oral Given 04/09/17 1013)  benzonatate (TESSALON) capsule 200 mg (200 mg Oral Given 04/09/17 1013)  ipratropium-albuterol (DUONEB) 0.5-2.5 (3) MG/3ML nebulizer solution 3 mL (3 mLs Nebulization Given 04/09/17 1148)  acetaminophen (TYLENOL) tablet 650 mg (650 mg Oral Given 04/09/17 1147)  albuterol (PROVENTIL) (2.5 MG/3ML) 0.083% nebulizer solution 5 mg (5 mg Nebulization Given 04/09/17 1147)  albuterol (PROVENTIL HFA;VENTOLIN HFA) 108 (90 Base) MCG/ACT inhaler 2 puff (2 puffs Inhalation Given 04/09/17 1352)  AEROCHAMBER PLUS FLO-VU MEDIUM MISC 1 each (1 each Other Given 04/09/17 1352)     Initial Impression / Assessment and Plan / ED Course  I have reviewed the triage vital signs and the nursing notes.  Pertinent labs & imaging results that were available during my care of the patient were reviewed by me and considered in my medical decision making (see  chart for details).     Patient in emergency department with flulike symptoms.  She is mildly tachycardic, but continues with coughing in the room.  Her lung exam is positive for wheezing, rhonchi, decreased air movement bilaterally.  Will get a chest x-ray, start duo nebs and prednisone.  Question viral URI versus influenza versus pneumonia.   X-ray negative.  Patient received 3 breathing treatments, she feels much better.  She is still wheezing and tight however.  She states she would like to try to go home.  She ambulated in the hallway with oxygen saturations remaining above 95% on room air.  I will discharge her home with prednisone taper, inhaler, Tessalon for cough.  I suspect this still could be a viral URI, possibly influenza, instructed to drink lots of fluids.  Tylenol for pain and fever.  Follow-up with family doctor. Return precautions discussed.   Vitals:   04/09/17  1002 04/09/17 1308 04/09/17 1314 04/09/17 1354  BP:    (!) 143/99  Pulse: (!) 115   88  Resp:    20  Temp:    98.5 F (36.9 C)  TempSrc:    Oral  SpO2: 94% 97% 95% 97%      Final Clinical Impressions(s) / ED Diagnoses   Final diagnoses:  Acute bronchitis, unspecified organism  Viral URI with cough    ED Discharge Orders    None       Jeannett Senior, PA-C 04/09/17 1602    Blanchie Dessert, MD 04/10/17 [phone]

## 2017-04-09 NOTE — ED Notes (Signed)
Patient Sats dropped to 95% walking

## 2017-04-09 NOTE — ED Triage Notes (Signed)
Pt c/o productive cough and flu like symptoms, ongoing since beginning of December 2018. Pt states intermittent fever x several days. Has taken otc cough meds, no relief.

## 2017-05-07 DEATH — deceased
